# Patient Record
Sex: Female | Born: 1995 | Race: White | Hispanic: No | Marital: Single | Smoking: Current some day smoker
Health system: Southern US, Community
[De-identification: ages and names within clinical notes are randomized; demographics above are authoritative.]

## PROBLEM LIST (undated history)

## (undated) DIAGNOSIS — F419 Anxiety disorder, unspecified: Secondary | ICD-10-CM

## (undated) HISTORY — PX: COLONOSCOPY: SHX174

## (undated) HISTORY — PX: OTHER SURGICAL HISTORY: SHX169

## (undated) HISTORY — PX: ESOPHAGOGASTRODUODENOSCOPY: SHX1529

## (undated) HISTORY — PX: TONSILLECTOMY: SUR1361

---
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
## 2017-01-16 ENCOUNTER — Emergency Department
Admission: EM | Admit: 2017-01-16 | Discharge: 2017-01-17 | Disposition: A | Discharge status: Home / Self Care | Payer: Medicaid Other | Attending: Emergency Medicine | Admitting: Emergency Medicine

## 2017-01-16 ENCOUNTER — Encounter: Payer: Self-pay | Admitting: Emergency Medicine

## 2017-01-16 DIAGNOSIS — Y999 Unspecified external cause status: Secondary | ICD-10-CM | POA: Insufficient documentation

## 2017-01-16 DIAGNOSIS — Y939 Activity, unspecified: Secondary | ICD-10-CM | POA: Diagnosis not present

## 2017-01-16 DIAGNOSIS — W260XXA Contact with knife, initial encounter: Secondary | ICD-10-CM | POA: Diagnosis not present

## 2017-01-16 DIAGNOSIS — Y929 Unspecified place or not applicable: Secondary | ICD-10-CM | POA: Insufficient documentation

## 2017-01-16 DIAGNOSIS — S61012A Laceration without foreign body of left thumb without damage to nail, initial encounter: Secondary | ICD-10-CM | POA: Diagnosis present

## 2017-01-16 DIAGNOSIS — S61412A Laceration without foreign body of left hand, initial encounter: Secondary | ICD-10-CM

## 2017-01-16 MED ORDER — LIDOCAINE HCL (PF) 1 % IJ SOLN
2.0000 mL | Freq: Once | INTRAMUSCULAR | Status: AC
  Administered 2017-01-16: 2 mL

## 2017-01-16 MED ORDER — LIDOCAINE HCL (PF) 1 % IJ SOLN
INTRAMUSCULAR | Status: AC
  Filled 2017-01-16: qty 5

## 2017-01-16 NOTE — ED Triage Notes (Signed)
Pt states that she was cutting open a package and the knife slipped and she tried to catch it with her left hand. Pt has laceration to left thumb. Bleeding is controlled and hand is wrapped at this time. Pt is ambulatory to triage with NAD noted at this time.

## 2017-01-16 NOTE — ED Notes (Signed)
2cm laceration to left palm, bleeding controlled at this time, irrigated with sterile saline

## 2017-01-17 NOTE — Discharge Instructions (Signed)
Return to the ER for any increase in pain and redness warmth or drainage. Follow-up in 8-10 days for suture removal. Keep clean and covered. Do not submerge in dirty water.

## 2017-01-17 NOTE — ED Provider Notes (Signed)
[facility]-EMERGENCY DEPARTMENT Provider Note   CSN: [id number] Arrival date & time: 01/16/17  2236     History   Chief Complaint Chief Complaint  Patient presents with  . Extremity Laceration    HPI TANNIA CONTINO is a 21 y.o. female presents to the emergency department for evaluation of laceration to the left hand. Patient dropped a knife with her right hand, caught it with her left and ended up suffering a laceration to the thenar eminence of the left hand. No numbness or tingling. No limited range of motion. Bleeding well controlled, tetanus up-to-date. Pain is mild.  HPI  History reviewed. No pertinent past medical history.  There are no active problems to display for this patient.   Past Surgical History:  Procedure Laterality Date  . adenoids    . TONSILLECTOMY      OB History    No data available       Home Medications    Prior to Admission medications   Not on File    Family History No family history on file.  Social History Social History  Substance Use Topics  . Smoking status: Never Smoker  . Smokeless tobacco: Never Used  . Alcohol use No     Allergies   Other and Peanut-containing drug products   Review of Systems Review of Systems  Constitutional: Negative for fever.  Skin: Positive for wound. Negative for color change.  Neurological: Negative for weakness and numbness.     Physical Exam Updated Vital Signs BP (!) 119/59 (BP Location: Left Arm)   Pulse 78   Temp 98.3 F (36.8 C) (Oral)   Resp 16   Ht 5\' 10"  (1.778 m)   Wt 71.7 kg   LMP 01/16/2017 (Exact Date)   SpO2 100%   BMI 22.67 kg/m   Physical Exam  Constitutional: She appears well-developed and well-nourished.  HENT:  Head: Normocephalic and atraumatic.  Eyes: Conjunctivae and EOM are normal.  Neck: Normal range of motion.  Cardiovascular: Normal rate.   Pulmonary/Chest: No respiratory distress.  Musculoskeletal:  Examination of the left hand shows  laceration to the thenar eminence, sensation is intact throughout the hand. She has full range of motion of thumb with no discomfort. No sign of foreign body. Laceration is linear, 3 cm.     ED Treatments / Results  Labs (all labs ordered are listed, but only abnormal results are displayed) Labs Reviewed - No data to display  EKG  EKG Interpretation None       Radiology No results found.  Procedures Procedures (including critical care time) LACERATION REPAIR Performed by: Patience Musca Authorized by: Patience Musca Consent: Verbal consent obtained. Risks and benefits: risks, benefits and alternatives were discussed Consent given by: patient Patient identity confirmed: provided demographic data Prepped and Draped in normal sterile fashion Wound explored  Laceration Location: Left thumb base  Laceration Length: 31 cm  No Foreign Bodies seen or palpated  Anesthesia: local infiltration  Local anesthetic: lidocaine 1% without epinephrine  Anesthetic total: 3 ml  Irrigation method: syringe Amount of cleaning: standard  Skin closure: Simple interrupted 5-0 nylon   Number of sutures: 5   Technique: Simple interrupted   Patient tolerance: Patient tolerated the procedure well with no immediate complications.    Medications Ordered in ED Medications  lidocaine (PF) (XYLOCAINE) 1 % injection 2 mL (2 mLs Infiltration Given 01/16/17 2336)     Initial Impression / Assessment and Plan / ED Course  I have  reviewed the triage vital signs and the nursing notes.  Pertinent labs & imaging results that were available during my care of the patient were reviewed by me and considered in my medical decision making (see chart for details).     21 year old female with laceration to the left hand. Neurovascularly intact no tendon deficits noted. No sign of foreign body. Tetanus is up-to-date. Laceration repaired with #5 5-0 nylon sutures. She is  educated on laceration and wound care. Will follow-up in 8-10 days for suture removal. Return sooner for any redness warmth or drainage.  Final Clinical Impressions(s) / ED Diagnoses   Final diagnoses:  Laceration of left hand without foreign body, initial encounter    New Prescriptions New Prescriptions   No medications on file     Evon Slackhomas C Gaines, PA-C 01/17/17 0024    Emily FilbertJonathan E Williams, MD 01/17/17 [phone]

## 2017-03-21 ENCOUNTER — Encounter: Payer: Self-pay | Admitting: Emergency Medicine

## 2017-03-21 ENCOUNTER — Emergency Department
Admission: EM | Admit: 2017-03-21 | Discharge: 2017-03-21 | Disposition: A | Discharge status: Home / Self Care | Payer: BLUE CROSS/BLUE SHIELD | Attending: Emergency Medicine | Admitting: Emergency Medicine

## 2017-03-21 ENCOUNTER — Emergency Department: Payer: BLUE CROSS/BLUE SHIELD

## 2017-03-21 DIAGNOSIS — M7541 Impingement syndrome of right shoulder: Secondary | ICD-10-CM | POA: Diagnosis not present

## 2017-03-21 DIAGNOSIS — M25511 Pain in right shoulder: Secondary | ICD-10-CM | POA: Diagnosis present

## 2017-03-21 MED ORDER — KETOROLAC TROMETHAMINE 30 MG/ML IJ SOLN
30.0000 mg | Freq: Once | INTRAMUSCULAR | Status: AC
  Administered 2017-03-21: 30 mg via INTRAMUSCULAR
  Filled 2017-03-21: qty 1

## 2017-03-21 MED ORDER — MELOXICAM 15 MG PO TABS: 15.0000 mg | ORAL_TABLET | Freq: Every day | ORAL | 0 refills | Status: DC

## 2017-03-21 NOTE — ED Provider Notes (Signed)
[facility] Emergency Department Provider Note  ____________________________________________  Time seen: Approximately 8:43 PM  I have reviewed the triage vital signs and the nursing notes.   HISTORY  Chief Complaint Shoulder Injury (R)    HPI Wendy Stone is a 21 y.o. female He presents emergency department complaining of right shoulder pain with numbness and tingling down the right arm. Patient states that she went to jump on her other sister's back and felt a sharp pain to the anterior aspect of her right shoulder. Patient reports that her entire armwith instantly numb. Patient states that she lost sensation to the arm. Since injury, patient has regained majority of sensation. She has good range of motion. She reports that extension or flexion of the shoulder past 90 does elicit some pain to the anterior aspect of the shoulder. She denies any neck pain. She reports that she still has some residual numbness in the thumb, second digit, third digit but that sensation has completely returned to the fourth and fifth digits. No medications prior to arrival.   History reviewed. No pertinent past medical history.  There are no active problems to display for this patient.   Past Surgical History:  Procedure Laterality Date  . adenoids    . TONSILLECTOMY      Prior to Admission medications   Medication Sig Start Date End Date Taking? Authorizing Provider  meloxicam (MOBIC) 15 MG tablet Take 1 tablet (15 mg total) by mouth daily. 03/21/17   Delorise Royals Cuthriell, PA-C    Allergies Other and Peanut-containing drug products  History reviewed. No pertinent family history.  Social History Social History  Substance Use Topics  . Smoking status: Never Smoker  . Smokeless tobacco: Never Used  . Alcohol use No     Review of Systems  Constitutional: No fever/chills Eyes: No visual changes.  Cardiovascular: no chest pain. Respiratory: no cough. No  SOB. Gastrointestinal: No abdominal pain.  No nausea, no vomiting.   Musculoskeletal: positive for right shoulder pain with numbness and tingling of the right arm. Skin: Negative for rash, abrasions, lacerations, ecchymosis. Neurological: Negative for headaches, focal weakness or numbness. 10-point ROS otherwise negative.  ____________________________________________   PHYSICAL EXAM:  VITAL SIGNS: ED Triage Vitals  Enc Vitals Group     BP 03/21/17 1835 123/62     Pulse Rate 03/21/17 1835 78     Resp 03/21/17 1835 18     Temp 03/21/17 1835 98.3 F (36.8 C)     Temp Source 03/21/17 1835 Oral     SpO2 03/21/17 1835 100 %     Weight 03/21/17 1835 163 lb (73.9 kg)     Height 03/21/17 1835  (1.778 m)     Head Circumference --      Peak Flow --      Pain Score 03/21/17 1834 0     Pain Loc --      Pain Edu? --      Excl. in GC? --      Constitutional: Alert and oriented. Well appearing and in no acute distress. Eyes: Conjunctivae are normal. PERRL. EOMI. Head: Atraumatic. Neck: No stridor.  No cervical spine tenderness to palpation.  Cardiovascular: Normal rate, regular rhythm. Normal S1 and S2.  Good peripheral circulation. Respiratory: Normal respiratory effort without tachypnea or retractions. Lungs CTAB. Good air entry to the bases with no decreased or absent breath sounds. Musculoskeletal: Full range of motion to all extremities. No gross deformities appreciated.no visible deformity or  edema noted to the right shoulder but inspection. With coaxing, patient has full range of motion. There is pain with range of motion above 90 on flexion and extension. Patient is mildly tender to palpation over the [facility] joint space. No palpable abnormality over the musculature or osseous structures of the right shoulder. Examination of the cervical spine and right elbow are unremarkable. Radial pulse intact distally. Patient has sensation 5 digits but decreased sensation to the thumb, second  digit, third digit.  Neurologic:  Normal speech and language. No gross focal neurologic deficits are appreciated.  Skin:  Skin is warm, dry and intact. No rash noted. Psychiatric: Mood and affect are normal. Speech and behavior are normal. Patient exhibits appropriate insight and judgement.   ____________________________________________   LABS (all labs ordered are listed, but only abnormal results are displayed)  Labs Reviewed - No data to display ____________________________________________  EKG   ____________________________________________  RADIOLOGY Festus Barren Cuthriell, personally viewed and evaluated these images (plain radiographs) as part of my medical decision making, as well as reviewing the written report by the radiologist.  Dg Shoulder Right  Result Date: 03/21/2017 CLINICAL DATA:  Right shoulder pain EXAM: RIGHT SHOULDER - 2+ VIEW COMPARISON:  None. FINDINGS: There is no evidence of fracture or dislocation. There is no evidence of arthropathy or other focal bone abnormality. Soft tissues are unremarkable. IMPRESSION: Negative. Electronically Signed   By: Signa Kell M.D.   On: 03/21/2017 19:02    ____________________________________________    PROCEDURES  Procedure(s) performed:    Procedures    Medications  ketorolac (TORADOL) 30 MG/ML injection 30 mg (not administered)     ____________________________________________   INITIAL IMPRESSION / ASSESSMENT AND PLAN / ED COURSE  Pertinent labs & imaging results that were available during my care of the patient were reviewed by me and considered in my medical decision making (see chart for details).  Review of the [place] CSRS was performed in accordance of the [facility] prior to dispensing any controlled drugs.     Patient's diagnosis is consistent with impingement syndrome to the right shoulder. Patient had jerking injury to the right shoulder. She immediately had complete numbness to the right upper  extremity. This is resolving with some residual minor numbness to the thumb, second digit, third digit. X-ray reveals no acute osseous abnormality. Exam is consistent with impingement syndrome.patient is given an injection of Toradol in the emergency department. Patient will be discharged home with prescriptions for meloxicam. Patient is to follow up with orthopedics as needed or otherwise directed. Patient is given ED precautions to return to the ED for any worsening or new symptoms.     ____________________________________________  FINAL CLINICAL IMPRESSION(S) / ED DIAGNOSES  Final diagnoses:  Impingement syndrome of right shoulder      NEW MEDICATIONS STARTED DURING THIS VISIT:  New Prescriptions   MELOXICAM (MOBIC) 15 MG TABLET    Take 1 tablet (15 mg total) by mouth daily.        This chart was dictated using voice recognition software/Dragon. Despite best efforts to proofread, errors can occur which can change the meaning. Any change was purely unintentional.    Racheal Patches, PA-C 03/21/17 4098    Merrily Brittle, MD 03/21/17 [phone]

## 2017-03-21 NOTE — ED Triage Notes (Signed)
Pt presents to c/o R shoulder injury. Pt states she jumped onto her sister's back and felt pain go from R side of neck into hand. States RUE feels numb at this time. Pulses, grip strength, and cap refill equal bilaterally. Limited ROM to shoulder d/t pain. No obvious deformity noted.

## 2017-03-21 NOTE — ED Notes (Signed)

## 2017-04-29 ENCOUNTER — Ambulatory Visit: Payer: BLUE CROSS/BLUE SHIELD | Admitting: Family

## 2017-07-06 ENCOUNTER — Encounter: Payer: Self-pay | Admitting: Emergency Medicine

## 2017-07-06 ENCOUNTER — Emergency Department
Admission: EM | Admit: 2017-07-06 | Discharge: 2017-07-06 | Disposition: A | Discharge status: Home / Self Care | Payer: BLUE CROSS/BLUE SHIELD | Attending: Emergency Medicine | Admitting: Emergency Medicine

## 2017-07-06 ENCOUNTER — Emergency Department: Payer: BLUE CROSS/BLUE SHIELD

## 2017-07-06 DIAGNOSIS — M25511 Pain in right shoulder: Secondary | ICD-10-CM | POA: Diagnosis present

## 2017-07-06 DIAGNOSIS — M5410 Radiculopathy, site unspecified: Secondary | ICD-10-CM | POA: Diagnosis not present

## 2017-07-06 MED ORDER — CYCLOBENZAPRINE HCL 10 MG PO TABS: 10.0000 mg | ORAL_TABLET | Freq: Three times a day (TID) | ORAL | 0 refills | Status: DC | PRN

## 2017-07-06 MED ORDER — MELOXICAM 15 MG PO TABS: 15.0000 mg | ORAL_TABLET | Freq: Every day | ORAL | 0 refills | Status: DC

## 2017-07-06 NOTE — Discharge Instructions (Signed)
Follow up with the orthopedic doctor for symptoms that are not improving over the next few days.  Return to the ER for symptoms that change or worsen if unable to schedule an appointment.

## 2017-07-06 NOTE — ED Triage Notes (Signed)
Presents with right shoulder pain which started 2 days ago  Denies any injury   States she is having increased pain with movement

## 2017-07-06 NOTE — ED Provider Notes (Signed)
[facility] Emergency Department Provider Note ____________________________________________  Time seen: Approximately 4:01 PM  I have reviewed the triage vital signs and the nursing notes.   HISTORY  Chief Complaint Shoulder Pain    HPI Wendy Stone is a 21 y.o. female who presents to the emergency department for evaluation of nontraumatic right shoulder pain. She has a history of 2 previous shoulder dislocations, but denies recent injury. Pain started while driving and radiates into the right shoulder blade, neck, and down the arm into the hand. No relief with tylenol or ibuprofen.  History reviewed. No pertinent past medical history.  There are no active problems to display for this patient.   Past Surgical History:  Procedure Laterality Date  . adenoids    . TONSILLECTOMY      Prior to Admission medications   Medication Sig Start Date End Date Taking? Authorizing Provider  PARoxetine (PAXIL) 10 MG tablet Take 10 mg by mouth daily.   Yes [provider]  cyclobenzaprine (FLEXERIL) 10 MG tablet Take 1 tablet (10 mg total) by mouth 3 (three) times daily as needed for muscle spasms. 07/06/17   Lakiesha Ralphs, Rulon Eisenmenger B, FNP  meloxicam (MOBIC) 15 MG tablet Take 1 tablet (15 mg total) by mouth daily. 07/06/17   Carmyn Hamm, Rulon Eisenmenger B, FNP    Allergies Other; Peanut-containing drug products; Augmentin [amoxicillin-pot clavulanate]; Keflex [cephalexin]; and Meclizine  No family history on file.  Social History Social History  Substance Use Topics  . Smoking status: Never Smoker  . Smokeless tobacco: Never Used  . Alcohol use No    Review of Systems Constitutional: Negative for recent injury Cardiovascular: Negative for change in skin temperature or color. Respiratory: Negative for cough or shortness of breath. Musculoskeletal: Positive for right shoulder pain with radiation. Skin: Negative for rash, lesion, or wound.  Neurological: Positive for  radiculopathy from the right shoulder.  ____________________________________________   PHYSICAL EXAM:  VITAL SIGNS: ED Triage Vitals  Enc Vitals Group     BP 07/06/17 1527 107/60     Pulse Rate 07/06/17 1527 88     Resp 07/06/17 1527 16     Temp 07/06/17 1527 98 F (36.7 C)     Temp Source 07/06/17 1527 Oral     SpO2 07/06/17 1527 99 %     Weight 07/06/17 1521 159 lb (72.1 kg)     Height 07/06/17 1521 5\' 10"  (1.778 m)     Head Circumference --      Peak Flow --      Pain Score 07/06/17 1521 7     Pain Loc --      Pain Edu? --      Excl. in GC? --     Constitutional: Alert and oriented. Well appearing and in no acute distress. Eyes: Conjunctivae are clear without discharge or drainage.  Head: Atraumatic. Neck: Full, active ROM observed.  Respiratory: Breath sounds clear throughout. Musculoskeletal: Right shoulder ROM limited by pain--worse with abduction past 90* and external rotation. Tenderness in the anterior subacromial bursa area on palpation. Grip strength is equal. Neurologic: Radiculopathy reported to the right lateral neck, right scapula, down the anterior portion of the right humerus, forearm, and hand.  Skin: Atraumatic.  Psychiatric: Affect and behavior are normal.  ____________________________________________   LABS (all labs ordered are listed, but only abnormal results are displayed)  Labs Reviewed - No data to display ____________________________________________  RADIOLOGY  No acute bony abnormality of the right shoulder per radiology. ____________________________________________  PROCEDURES  Procedure(s) performed: None.  ____________________________________________   INITIAL IMPRESSION / ASSESSMENT AND PLAN / ED COURSE  Wendy Stone is a 21 y.o. female who presents to the emergency department for evaluation of right shoulder pain that started while driving. Pain is unlike previous shoulder pain. No relief with tylenol or ibuprofen at  home. On exam, focal tenderness is located over the anterior joint line. X-ray ordered.  4:23 PM  Right shoulder image is negative for bony abnormality. She will be given a prescription for meloxicam and flexeril and advised to follow up with orthopedics for symptoms that are not improving over the next week or so.   Pertinent labs & imaging results that were available during my care of the patient were reviewed by me and considered in my medical decision making (see chart for details).  _________________________________________   FINAL CLINICAL IMPRESSION(S) / ED DIAGNOSES  Final diagnoses:  Acute pain of right shoulder  Radiculopathy, unspecified spinal region    Discharge Medication List as of 07/06/2017  4:26 PM    START taking these medications   Details  cyclobenzaprine (FLEXERIL) 10 MG tablet Take 1 tablet (10 mg total) by mouth 3 (three) times daily as needed for muscle spasms., Starting Mon 07/06/2017, Print        If controlled substance prescribed during this visit, 12 month history viewed on the [facility] prior to issuing an initial prescription for Schedule II or III opiod.    Chinita Pesterriplett, Loney Domingo B, FNP 07/06/17 1657    Phineas SemenGoodman, Graydon, MD 07/06/17 [id number]

## 2017-11-05 ENCOUNTER — Other Ambulatory Visit: Payer: Self-pay | Admitting: Sports Medicine

## 2017-11-05 DIAGNOSIS — M25311 Other instability, right shoulder: Secondary | ICD-10-CM

## 2017-11-05 DIAGNOSIS — M25511 Pain in right shoulder: Principal | ICD-10-CM

## 2017-11-05 DIAGNOSIS — G8929 Other chronic pain: Secondary | ICD-10-CM

## 2017-11-26 ENCOUNTER — Ambulatory Visit
Admission: RE | Admit: 2017-11-26 | Discharge: 2017-11-26 | Disposition: A | Discharge status: Home / Self Care | Payer: BLUE CROSS/BLUE SHIELD | Source: Ambulatory Visit | Attending: Sports Medicine | Admitting: Sports Medicine

## 2017-11-26 DIAGNOSIS — X58XXXA Exposure to other specified factors, initial encounter: Secondary | ICD-10-CM | POA: Diagnosis not present

## 2017-11-26 DIAGNOSIS — S43431A Superior glenoid labrum lesion of right shoulder, initial encounter: Secondary | ICD-10-CM | POA: Insufficient documentation

## 2017-11-26 DIAGNOSIS — M25511 Pain in right shoulder: Principal | ICD-10-CM

## 2017-11-26 DIAGNOSIS — G8929 Other chronic pain: Secondary | ICD-10-CM

## 2017-11-26 DIAGNOSIS — M25311 Other instability, right shoulder: Secondary | ICD-10-CM

## 2017-11-26 DIAGNOSIS — S46111A Strain of muscle, fascia and tendon of long head of biceps, right arm, initial encounter: Secondary | ICD-10-CM | POA: Diagnosis not present

## 2017-11-26 MED ORDER — SODIUM CHLORIDE 0.9 % IJ SOLN
5.0000 mL | INTRAMUSCULAR | Status: DC | PRN
  Administered 2017-11-26: 5 mL
  Filled 2017-11-26: qty 10

## 2017-11-26 MED ORDER — IOPAMIDOL (ISOVUE-200) INJECTION 41%
17.0000 mL | Freq: Once | INTRAVENOUS | Status: AC | PRN
  Administered 2017-11-26: 17 mL
  Filled 2017-11-26: qty 50

## 2017-11-26 MED ORDER — GADOBENATE DIMEGLUMINE 529 MG/ML IV SOLN
0.1000 mL | Freq: Once | INTRAVENOUS | Status: AC | PRN
  Administered 2017-11-26: 0.1 mL via INTRA_ARTICULAR

## 2017-11-26 MED ORDER — LIDOCAINE HCL (PF) 1 % IJ SOLN
5.0000 mL | Freq: Once | INTRAMUSCULAR | Status: AC
  Administered 2017-11-26: 5 mL
  Filled 2017-11-26: qty 5

## 2018-02-08 ENCOUNTER — Encounter: Payer: Self-pay | Admitting: Emergency Medicine

## 2018-02-08 ENCOUNTER — Other Ambulatory Visit: Payer: Self-pay

## 2018-02-08 ENCOUNTER — Emergency Department: Payer: BLUE CROSS/BLUE SHIELD

## 2018-02-08 ENCOUNTER — Emergency Department
Admission: EM | Admit: 2018-02-08 | Discharge: 2018-02-08 | Disposition: A | Discharge status: Home / Self Care | Payer: BLUE CROSS/BLUE SHIELD | Attending: Emergency Medicine | Admitting: Emergency Medicine

## 2018-02-08 DIAGNOSIS — M65831 Other synovitis and tenosynovitis, right forearm: Secondary | ICD-10-CM | POA: Diagnosis not present

## 2018-02-08 DIAGNOSIS — Z79899 Other long term (current) drug therapy: Secondary | ICD-10-CM | POA: Diagnosis not present

## 2018-02-08 DIAGNOSIS — M25531 Pain in right wrist: Secondary | ICD-10-CM | POA: Diagnosis present

## 2018-02-08 DIAGNOSIS — M778 Other enthesopathies, not elsewhere classified: Secondary | ICD-10-CM

## 2018-02-08 MED ORDER — PREDNISONE 10 MG PO TABS: ORAL_TABLET | ORAL | 0 refills | Status: DC

## 2018-02-08 NOTE — Discharge Instructions (Signed)
Follow-up with your primary care doctor or Dr. Joice LoftsPoggi who is the orthopedist on call today.  Take medication as directed with tapering dose of prednisone.  You may take Tylenol with this medication.  If any continued problems then you will need to make an appointment with Dr. Gigi GinPeggy.  You may use your wrist splint for support for the next 7-10 days.  Ice and elevation as needed for pain or swelling.

## 2018-02-08 NOTE — ED Notes (Signed)
See triage note.  Presents with right wrist pain for about 1 week  Denies any injury  States she is having increased pain with movement  States she is using a splint with min relief and has used OTC meds

## 2018-02-08 NOTE — ED Triage Notes (Signed)
Pt with right wrist pain for one week, no known injury.

## 2018-02-08 NOTE — ED Provider Notes (Signed)
[facility] Emergency Department Provider Note  ____________________________________________   First MD Initiated Contact with Patient 02/08/18 0848     (approximate)  I have reviewed the triage vital signs and the nursing notes.   HISTORY  Chief Complaint Wrist Pain   HPI Wendy Stone is a 22 y.o. female is here with complaint of right wrist pain.  Patient states that she has had pain for 1 week.  She denies any injury.  She has not been taking any over-the-counter medication but has been wearing a wrist splint for support.  She states that in the past she has had 3 injuries including a fracture to the same wrist.  Patient is right-hand dominant.  She rates her pain as an 8 out of 10.  History reviewed. No pertinent past medical history.  There are no active problems to display for this patient.   Past Surgical History:  Procedure Laterality Date  . adenoids    . TONSILLECTOMY      Prior to Admission medications   Medication Sig Start Date End Date Taking? Authorizing Provider  PARoxetine (PAXIL) 10 MG tablet Take 10 mg by mouth daily.    [provider]  predniSONE (DELTASONE) 10 MG tablet Take 6 tablets  today, on day 2 take 5 tablets, day 3 take 4 tablets, day 4 take 3 tablets, day 5 take  2 tablets and 1 tablet the last day 02/08/18   Wendy Stone, Wendy Curvin L, PA-C    Allergies Other; Peanut-containing drug products; Augmentin [amoxicillin-pot clavulanate]; Keflex [cephalexin]; and Meclizine  No family history on file.  Social History Social History   Tobacco Use  . Smoking status: Never Smoker  . Smokeless tobacco: Never Used  Substance Use Topics  . Alcohol use: No  . Drug use: No    Review of Systems Constitutional: No fever/chills Cardiovascular: Denies chest pain. Respiratory: Denies shortness of breath. Gastrointestinal:  No nausea, no vomiting. Musculoskeletal: Positive for right wrist pain. Skin: Negative for  rash. Neurological: Negative for focal weakness or numbness. ___________________________________________   PHYSICAL EXAM:  VITAL SIGNS: ED Triage Vitals  Enc Vitals Group     BP 02/08/18 0816 121/63     Pulse Rate 02/08/18 0816 83     Resp 02/08/18 0816 20     Temp 02/08/18 0816 98 F (36.7 C)     Temp Source 02/08/18 0816 Oral     SpO2 02/08/18 0816 100 %     Weight 02/08/18 0817 159 lb (72.1 kg)     Height --      Head Circumference --      Peak Flow --      Pain Score 02/08/18 0817 8     Pain Loc --      Pain Edu? --      Excl. in GC? --    Constitutional: Alert and oriented. Well appearing and in no acute distress. Eyes: Conjunctivae are normal.  Head: Atraumatic. Neck: No stridor.   Cardiovascular: Normal rate, regular rhythm. Grossly normal heart sounds.  Good peripheral circulation. Respiratory: Normal respiratory effort.  No retractions. Lungs CTAB. Musculoskeletal: On examination of the right wrist there is no gross deformity and no soft tissue swelling present.  Patient is resistant to movement in flexion and extension secondary to pain.  Pulses present.  Patient is able to make a complete fist without limitations.  Motor sensory function intact.  No ecchymosis, abrasions, erythema noted on exam. Neurologic:  Normal speech and language.  No gross focal neurologic deficits are appreciated.  Skin:  Skin is warm, dry and intact.  Psychiatric: Mood and affect are normal. Speech and behavior are normal.  ____________________________________________   LABS (all labs ordered are listed, but only abnormal results are displayed)  Labs Reviewed - No data to display  RADIOLOGY  ED MD interpretation:   Right wrist x-ray is negative for fracture.  Official radiology report(s): Dg Wrist Complete Right  Result Date: 02/08/2018 CLINICAL DATA:  Right wrist pain for the past week.  No injury. EXAM: RIGHT WRIST - COMPLETE 3+ VIEW COMPARISON:  None. FINDINGS: There is no  evidence of fracture or dislocation. There is no evidence of arthropathy or other focal bone abnormality. Soft tissues are unremarkable. IMPRESSION: Negative. Electronically Signed   By: Wendy Stone M.D.   On: 02/08/2018 08:43    ____________________________________________   PROCEDURES  Procedure(s) performed: None  Procedures  Critical Care performed: No  ____________________________________________   INITIAL IMPRESSION / ASSESSMENT AND PLAN / ED COURSE Patient is already taking ibuprofen without any relief.  She is to continue wearing her cockup wrist splint that she is wearing currently.  She was placed on prednisone to begin taking as a 60mg  6-day taper.  She will follow-up with her or Dr. Joice Stone who is on-call for orthopedics if any continued problems. ____________________________________________   FINAL CLINICAL IMPRESSION(S) / ED DIAGNOSES  Final diagnoses:  Tendinitis of right wrist     ED Discharge Orders        Ordered    predniSONE (DELTASONE) 10 MG tablet     02/08/18 0902       Note:  This document was prepared using Dragon voice recognition software and may include unintentional dictation errors.    Wendy Rumps, PA-C 02/08/18 1152    Wendy Sickle, MD 02/09/18 1721

## 2018-02-27 ENCOUNTER — Emergency Department
Admission: EM | Admit: 2018-02-27 | Discharge: 2018-02-27 | Disposition: A | Discharge status: Home / Self Care | Payer: BLUE CROSS/BLUE SHIELD | Attending: Emergency Medicine | Admitting: Emergency Medicine

## 2018-02-27 DIAGNOSIS — R0600 Dyspnea, unspecified: Secondary | ICD-10-CM | POA: Diagnosis present

## 2018-02-27 DIAGNOSIS — T7801XA Anaphylactic reaction due to peanuts, initial encounter: Secondary | ICD-10-CM | POA: Insufficient documentation

## 2018-02-27 DIAGNOSIS — Z91010 Allergy to peanuts: Secondary | ICD-10-CM

## 2018-02-27 DIAGNOSIS — T782XXA Anaphylactic shock, unspecified, initial encounter: Secondary | ICD-10-CM

## 2018-02-27 MED ORDER — PREDNISONE 20 MG PO TABS: 40.0000 mg | ORAL_TABLET | Freq: Every day | ORAL | 0 refills | Status: DC

## 2018-02-27 MED ORDER — FAMOTIDINE IN NACL 20-0.9 MG/50ML-% IV SOLN
20.0000 mg | Freq: Once | INTRAVENOUS | Status: AC
  Administered 2018-02-27: 20 mg via INTRAVENOUS

## 2018-02-27 MED ORDER — METHYLPREDNISOLONE SODIUM SUCC 125 MG IJ SOLR
125.0000 mg | Freq: Once | INTRAMUSCULAR | Status: AC
  Administered 2018-02-27: 125 mg via INTRAVENOUS

## 2018-02-27 MED ORDER — EPINEPHRINE 0.3 MG/0.3ML IJ SOAJ: 0.3000 mg | Freq: Once | INTRAMUSCULAR | 0 refills | Status: AC

## 2018-02-27 MED ORDER — EPINEPHRINE 0.3 MG/0.3ML IJ SOAJ
0.3000 mg | Freq: Once | INTRAMUSCULAR | Status: AC
  Administered 2018-02-27: 0.3 mg via INTRAMUSCULAR

## 2018-02-27 MED ORDER — DIPHENHYDRAMINE HCL 50 MG/ML IJ SOLN
50.0000 mg | Freq: Once | INTRAMUSCULAR | Status: AC
  Administered 2018-02-27: 50 mg via INTRAVENOUS

## 2018-02-27 NOTE — ED Triage Notes (Signed)
Pt working downstairs on the lower level, states that she touched peanuts that were on the table, pt started itching to the side of her neck, states that she started coughing with difficulty breathing

## 2018-02-27 NOTE — ED Provider Notes (Signed)
[facility] Emergency Department Provider Note   ____________________________________________   First MD Initiated Contact with Patient 02/27/18 1218     (approximate)  I have reviewed the triage vital signs and the nursing notes.   HISTORY  Chief Complaint Allergic Reaction  EM caveat: Severe respiratory distress  HPI Kamryn Messineo Minnifield is a 22 y.o. female reports previously healthy except for tendinitis  Patient was working just about 15-20 minutes ago, she reports that she was exposed and excellently touched peanuts that the patient had/peanut butter.  She then began having severe itching trouble breathing and swallowing.  Patient reports a scratching feeling like her throat is closing.  Took an epinephrine autoinjector about 5 minutes ago     No past medical history on file. Severe peanut allergy There are no active problems to display for this patient.   Past Surgical History:  Procedure Laterality Date  . adenoids    . TONSILLECTOMY      Prior to Admission medications   Medication Sig Start Date End Date Taking? Authorizing Provider  EPINEPHrine 0.3 mg/0.3 mL IJ SOAJ injection Inject 0.3 mLs (0.3 mg total) into the muscle once for 1 dose. 02/27/18 02/27/18  Sharyn Creamer, MD  PARoxetine (PAXIL) 10 MG tablet Take 10 mg by mouth daily.    [provider]  predniSONE (DELTASONE) 20 MG tablet Take 2 tablets (40 mg total) by mouth daily with breakfast. 02/27/18   Sharyn Creamer, MD    Allergies Other; Peanut-containing drug products; Augmentin [amoxicillin-pot clavulanate]; Keflex [cephalexin]; and Meclizine  No family history on file.  Social History Social History   Tobacco Use  . Smoking status: Never Smoker  . Smokeless tobacco: Never Used  Substance Use Topics  . Alcohol use: No  . Drug use: No    Review of Systems EM caveat: Severe respiratory distress ____________________________________________   PHYSICAL  EXAM:  VITAL SIGNS: ED Triage Vitals [02/27/18 1219]  Enc Vitals Group     BP (!) 152/70     Pulse Rate (!) 128     Resp (!) 28     Temp      Temp src      SpO2 100 %     Weight      Height      Head Circumference      Peak Flow      Pain Score      Pain Loc      Pain Edu?      Excl. in GC?     Constitutional: Alert and oriented.  Sitting upright somewhat tripoding holding her hands over her throat reporting trouble breathing.  Also coughing frequently with a dry cough. Eyes: Conjunctivae are normal. Head: Atraumatic. Nose: No congestion/rhinnorhea. Mouth/Throat: Mucous membranes are moist.  No oral pharyngeal edema denoted. Neck: No stridor.  Itching with some hives/excoriations noted over her anterior neck Cardiovascular: Tachycardic rate, regular rhythm. Grossly normal heart sounds.  Good peripheral circulation. Respiratory: Lungs are clear bilateral, but she is a frequent dry cough, and reports it feels like her throat was closing.  Sitting upright. Gastrointestinal: Soft and nontender. No distention. Musculoskeletal: No lower extremity tenderness nor edema. Neurologic:  Normal speech and language. No gross focal neurologic deficits are appreciated.  Skin:  Skin is warm, dry and intact. No rash noted. Psychiatric: Mood and affect are very anxious. Speech and behavior are normal.  ____________________________________________   LABS (all labs ordered are listed, but only abnormal results are displayed)  Labs Reviewed - No data to display ____________________________________________  EKG  Reviewed and told by me at 1230 Heart rate 130 QRS 100 QTC 430 Sinus tachycardia ____________________________________________  RADIOLOGY   ____________________________________________   PROCEDURES  Procedure(s) performed: None  Procedures  Critical Care performed: Yes, see critical care note(s)  CRITICAL CARE Performed by: Sharyn Creamer   Total critical care time:  35 minutes  Critical care time was exclusive of separately billable procedures and treating other patients.  Critical care was necessary to treat or prevent imminent or life-threatening deterioration.  Critical care was time spent personally by me on the following activities: development of treatment plan with patient and/or surrogate as well as nursing, discussions with consultants, evaluation of patient's response to treatment, examination of patient, obtaining history from patient or surrogate, ordering and performing treatments and interventions, ordering and review of laboratory studies, ordering and review of radiographic studies, pulse oximetry and re-evaluation of patient's condition.  ____________________________________________   INITIAL IMPRESSION / ASSESSMENT AND PLAN / ED COURSE  Pertinent labs & imaging results that were available during my care of the patient were reviewed by me and considered in my medical decision making (see chart for details).  Patient presents with sudden severe shortness of breath and difficulty breathing with scratching and throat closing sensation after being exposed to peanuts, unknown allergen to her.  She self administered an epinephrine autoinjector but still has ongoing distress and feeling like her throat is tight or scratching.  Patient administered an additional auto injector in the ER due to ongoing symptomatology involving her potential upper airway involvement.  Clinical Course as of Feb 28 1523  Sat Feb 27, 2018  1223 Patient reporting improvement.  Reports she is able to breathe better now.  Still itching slightly, but reports she feels like her symptoms are much improved.  She is tremulous, slightly tachycardic likely from the 2 doses of epinephrine she received.  Not any pain.  She appears improved.  Respirating comfortably at this time.  No oral pharyngeal edema denoted.  No stridor.  [MQ]    Clinical Course User Index [MQ] Sharyn Creamer,  MD   ----------------------------------------- 1:39 PM on 02/27/2018 -----------------------------------------  Patient is resting comfortably.  Her father is at the bedside, patient reports she feels much better.  She is appearing much improved.  No ongoing evidence of allergic reaction or anaphylaxis.  Hemodynamics have normalized including her heart rate.  She is resting comfortably at this time.  Plan to observe her for a total of about 4 hours to assure no evidence of rebound anaphylaxis.  Patient reports that she used her last autoinjector, will refill this, also place her on prednisone and we discussed over-the-counter Benadryl for the next 2 days as well.  Patient and her father are in agreement.  Continue to observe her.  ----------------------------------------- 3:25 PM on 02/27/2018 -----------------------------------------  Patient being observed until about 4 PM.  Dr. Alphonzo Lemmings will follow up with her, the patient continues to do well anticipate discharge to home with renewal of her prescription for epinephrine as well as prednisone. ____________________________________________   FINAL CLINICAL IMPRESSION(S) / ED DIAGNOSES  Final diagnoses:  Anaphylaxis, initial encounter  Peanut allergy      NEW MEDICATIONS STARTED DURING THIS VISIT:  New Prescriptions   EPINEPHRINE 0.3 MG/0.3 ML IJ SOAJ INJECTION    Inject 0.3 mLs (0.3 mg total) into the muscle once for 1 dose.   PREDNISONE (DELTASONE) 20 MG TABLET    Take 2 tablets (40  mg total) by mouth daily with breakfast.     Note:  This document was prepared using Dragon voice recognition software and may include unintentional dictation errors.     Sharyn Creamer, MD 02/27/18 1525

## 2018-02-27 NOTE — ED Provider Notes (Signed)
-----------------------------------------   5:00 PM on 02/27/2018 -----------------------------------------  Patient signed out to me at 3:00 pending discharge, no signs or symptoms of anaphylaxis.  She is eager to go throat is clear no hives, extensive return present follow-up given and understood   Jeanmarie PlantMcShane, [name], MD 02/27/18 1701

## 2018-02-27 NOTE — ED Notes (Signed)
Pt reports decreased SOB.

## 2018-02-27 NOTE — ED Notes (Signed)
Pt reports feeling "much better". Family at bedside. No acute complaints voiced. Will continue to monitor.

## 2018-03-30 ENCOUNTER — Encounter: Payer: Self-pay | Admitting: Emergency Medicine

## 2018-03-30 ENCOUNTER — Emergency Department: Payer: BLUE CROSS/BLUE SHIELD

## 2018-03-30 ENCOUNTER — Emergency Department
Admission: EM | Admit: 2018-03-30 | Discharge: 2018-03-30 | Disposition: A | Discharge status: Home / Self Care | Payer: BLUE CROSS/BLUE SHIELD | Attending: Student in an Organized Health Care Education/Training Program | Admitting: Student in an Organized Health Care Education/Training Program

## 2018-03-30 DIAGNOSIS — Z79899 Other long term (current) drug therapy: Secondary | ICD-10-CM | POA: Insufficient documentation

## 2018-03-30 DIAGNOSIS — R1031 Right lower quadrant pain: Secondary | ICD-10-CM | POA: Insufficient documentation

## 2018-03-30 DIAGNOSIS — R079 Chest pain, unspecified: Secondary | ICD-10-CM | POA: Diagnosis not present

## 2018-03-30 DIAGNOSIS — Z91010 Allergy to peanuts: Secondary | ICD-10-CM | POA: Diagnosis not present

## 2018-03-30 LAB — COMPREHENSIVE METABOLIC PANEL
ALBUMIN: 4.3 g/dL (ref 3.5–5.0)
ALK PHOS: 49 U/L (ref 38–126)
ALT: 20 U/L (ref 14–54)
AST: 22 U/L (ref 15–41)
Anion gap: 4 — ABNORMAL LOW (ref 5–15)
BUN: 14 mg/dL (ref 6–20)
CALCIUM: 9 mg/dL (ref 8.9–10.3)
CHLORIDE: 108 mmol/L (ref 101–111)
CO2: 26 mmol/L (ref 22–32)
CREATININE: 0.75 mg/dL (ref 0.44–1.00)
GFR calc Af Amer: >60 mL/min (ref >60)
GFR calc non Af Amer: >60 mL/min (ref >60)
GLUCOSE: 100 mg/dL — AB (ref 65–99)
Potassium: 3.9 mmol/L (ref 3.5–5.1)
SODIUM: 138 mmol/L (ref 135–145)
Total Bilirubin: 0.5 mg/dL (ref 0.3–1.2)
Total Protein: 7.2 g/dL (ref 6.5–8.1)

## 2018-03-30 LAB — URINALYSIS, COMPLETE (UACMP) WITH MICROSCOPIC
Bilirubin Urine: NEGATIVE
GLUCOSE, UA: NEGATIVE mg/dL
HGB URINE DIPSTICK: NEGATIVE
Ketones, ur: NEGATIVE mg/dL
Leukocytes, UA: NEGATIVE
Nitrite: NEGATIVE
PROTEIN: NEGATIVE mg/dL
SPECIFIC GRAVITY, URINE: 1.006 (ref 1.005–1.030)
pH: 7 (ref 5.0–8.0)

## 2018-03-30 LAB — CBC
HCT: 38.4 % (ref 35.0–47.0)
Hemoglobin: 12.6 g/dL (ref 12.0–16.0)
MCH: 29.5 pg (ref 26.0–34.0)
MCHC: 32.9 g/dL (ref 32.0–36.0)
MCV: 89.9 fL (ref 80.0–100.0)
PLATELETS: 288 10*3/uL (ref 150–440)
RBC: 4.28 MIL/uL (ref 3.80–5.20)
RDW: 13.5 % (ref 11.5–14.5)
WBC: 5.3 10*3/uL (ref 3.6–11.0)

## 2018-03-30 LAB — LIPASE, BLOOD: LIPASE: 38 U/L (ref 11–51)

## 2018-03-30 LAB — FIBRIN DERIVATIVES D-DIMER (ARMC ONLY): Fibrin derivatives D-dimer (ARMC): 162.66 ng/mL (FEU) (ref 0.00–499.00)

## 2018-03-30 LAB — POCT PREGNANCY, URINE: PREG TEST UR: NEGATIVE

## 2018-03-30 MED ORDER — PROMETHAZINE HCL 25 MG/ML IJ SOLN
12.5000 mg | Freq: Four times a day (QID) | INTRAMUSCULAR | Status: DC | PRN
Start: 2018-03-30 — End: 2018-03-30
  Administered 2018-03-30: 12.5 mg via INTRAVENOUS
  Filled 2018-03-30: qty 1

## 2018-03-30 MED ORDER — LACTULOSE 10 G PO PACK: 10.0000 g | PACK | Freq: Three times a day (TID) | ORAL | 0 refills | Status: AC

## 2018-03-30 MED ORDER — DICYCLOMINE HCL 10 MG PO CAPS: 10.0000 mg | ORAL_CAPSULE | Freq: Three times a day (TID) | ORAL | 0 refills | Status: AC | PRN

## 2018-03-30 NOTE — ED Provider Notes (Signed)
[facility] Emergency Department Provider Note    First MD Initiated Contact with Patient 03/30/18 1417     (approximate)  I have reviewed the triage vital signs and the nursing notes.   HISTORY  Chief Complaint Abdominal Pain; Chest Pain; and Emesis    HPI Wendy Stone is a 22 y.o. female with a history of intermittent abdominal pain presents to the ER for about 1 week of intermittent severe right-sided abdominal pain that gets to the point where it causes her to have difficulty breathing and feeling short of breath.  States that he does have chest pain at this time and does have pain when taking deep inspiration.  She is on birth control.  Denies any fevers.  States she does have a history of constipation.  No dysuria or flank pain.  No hematuria.  No vaginal discharge.  Mother does have a history of endometriosis.  Denies any chance of being pregnant.  History reviewed. No pertinent past medical history. No family history on file. Past Surgical History:  Procedure Laterality Date  . adenoids    . TONSILLECTOMY     There are no active problems to display for this patient.     Prior to Admission medications   Medication Sig Start Date End Date Taking? Authorizing Provider  dicyclomine (BENTYL) 10 MG capsule Take 1 capsule (10 mg total) by mouth 3 (three) times daily as needed for up to 14 days for spasms. 03/30/18 04/13/18  Willy Eddy, MD  lactulose (CEPHULAC) 10 g packet Take 1 packet (10 g total) by mouth 3 (three) times daily. 03/30/18   Willy Eddy, MD  PARoxetine (PAXIL) 10 MG tablet Take 10 mg by mouth daily.    [provider]  predniSONE (DELTASONE) 20 MG tablet Take 2 tablets (40 mg total) by mouth daily with breakfast. 02/27/18   Sharyn Creamer, MD    Allergies Other; Peanut-containing drug products; Augmentin [amoxicillin-pot clavulanate]; Keflex [cephalexin]; and Meclizine    Social History Social History    Tobacco Use  . Smoking status: Never Smoker  . Smokeless tobacco: Never Used  Substance Use Topics  . Alcohol use: No  . Drug use: No    Review of Systems Patient denies headaches, rhinorrhea, blurry vision, numbness, shortness of breath, chest pain, edema, cough, abdominal pain, nausea, vomiting, diarrhea, dysuria, fevers, rashes or hallucinations unless otherwise stated above in HPI. ____________________________________________   PHYSICAL EXAM:  VITAL SIGNS: Vitals:   03/30/18 1248  BP: 118/69  Pulse: 76  Resp: 16  Temp: 97.6 F (36.4 C)  SpO2: 98%    Constitutional: Alert and oriented. Well appearing and in no acute distress. Eyes: Conjunctivae are normal.  Head: Atraumatic. Nose: No congestion/rhinnorhea. Mouth/Throat: Mucous membranes are moist.   Neck: No stridor. Painless ROM.  Cardiovascular: Normal rate, regular rhythm. Grossly normal heart sounds.  Good peripheral circulation. Respiratory: Normal respiratory effort.  No retractions. Lungs CTAB. Gastrointestinal: Soft and nontender. No distention. No abdominal bruits. No CVA tenderness. Genitourinary: deferred Musculoskeletal: No lower extremity tenderness nor edema.  No joint effusions. Neurologic:  Normal speech and language. No gross focal neurologic deficits are appreciated. No facial droop Skin:  Skin is warm, dry and intact. No rash noted. Psychiatric: Mood and affect are normal. Speech and behavior are normal.  ____________________________________________   LABS (all labs ordered are listed, but only abnormal results are displayed)  Results for orders placed or performed during the hospital encounter of 03/30/18 (from the past 24  hour(s))  Lipase, blood     Status: None   Collection Time: 03/30/18 12:51 PM  Result Value Ref Range   Lipase 38 11 - 51 U/L  Comprehensive metabolic panel     Status: Abnormal   Collection Time: 03/30/18 12:51 PM  Result Value Ref Range   Sodium 138 135 - 145 mmol/L    Potassium 3.9 3.5 - 5.1 mmol/L   Chloride 108 101 - 111 mmol/L   CO2 26 22 - 32 mmol/L   Glucose, Bld 100 (H) 65 - 99 mg/dL   BUN 14 6 - 20 mg/dL   Creatinine, Ser 1.61 0.44 - 1.00 mg/dL   Calcium 9.0 8.9 - 09.6 mg/dL   Total Protein 7.2 6.5 - 8.1 g/dL   Albumin 4.3 3.5 - 5.0 g/dL   AST 22 15 - 41 U/L   ALT 20 14 - 54 U/L   Alkaline Phosphatase 49 38 - 126 U/L   Total Bilirubin 0.5 0.3 - 1.2 mg/dL   GFR calc non Af Amer >60 >60 mL/min   GFR calc Af Amer >60 >60 mL/min   Anion gap 4 (L) 5 - 15  CBC     Status: None   Collection Time: 03/30/18 12:51 PM  Result Value Ref Range   WBC 5.3 3.6 - 11.0 K/uL   RBC 4.28 3.80 - 5.20 MIL/uL   Hemoglobin 12.6 12.0 - 16.0 g/dL   HCT 04.5 40.9 - 81.1 %   MCV 89.9 80.0 - 100.0 fL   MCH 29.5 26.0 - 34.0 pg   MCHC 32.9 32.0 - 36.0 g/dL   RDW 91.4 78.2 - 95.6 %   Platelets 288 150 - 440 K/uL  Urinalysis, Complete w Microscopic     Status: Abnormal   Collection Time: 03/30/18 12:51 PM  Result Value Ref Range   Color, Urine STRAW (A) YELLOW   APPearance CLEAR (A) CLEAR   Specific Gravity, Urine 1.006 1.005 - 1.030   pH 7.0 5.0 - 8.0   Glucose, UA NEGATIVE NEGATIVE mg/dL   Hgb urine dipstick NEGATIVE NEGATIVE   Bilirubin Urine NEGATIVE NEGATIVE   Ketones, ur NEGATIVE NEGATIVE mg/dL   Protein, ur NEGATIVE NEGATIVE mg/dL   Nitrite NEGATIVE NEGATIVE   Leukocytes, UA NEGATIVE NEGATIVE   RBC / HPF 0-5 0 - 5 RBC/hpf   WBC, UA 0-5 0 - 5 WBC/hpf   Bacteria, UA RARE (A) NONE SEEN   Squamous Epithelial / LPF 0-5 (A) NONE SEEN  Pregnancy, urine POC     Status: None   Collection Time: 03/30/18 12:57 PM  Result Value Ref Range   Preg Test, Ur NEGATIVE NEGATIVE  Fibrin derivatives D-Dimer ([facility] only)     Status: None   Collection Time: 03/30/18  2:48 PM  Result Value Ref Range   Fibrin derivatives D-dimer ([facility]) 162.66 0.00 - 499.00 ng/mL (FEU)   ____________________________________________  EKG My review and personal interpretation at Time:  12:49   Indication: chest pain  Rate: 80  Rhythm: sinus Axis: normal Other: normal intervals, no stemi, no brugada or wpw ____________________________________________  RADIOLOGY  I personally reviewed all radiographic images ordered to evaluate for the above acute complaints and reviewed radiology reports and findings.  These findings were personally discussed with the patient.  Please see medical record for radiology report.  ____________________________________________   PROCEDURES  Procedure(s) performed:  Procedures    Critical Care performed: no ____________________________________________   INITIAL IMPRESSION / ASSESSMENT AND PLAN / ED COURSE  Pertinent labs &  imaging results that were available during my care of the patient were reviewed by me and considered in my medical decision making (see chart for details).  DDX: Constipation, enteritis, IBD, IBS, torsion, PE, pericarditis  Wendy Stone is a 22 y.o. who presents to the ED with symptoms as described above.  Patient is well-appearing and in no acute distress.  At this time her abdominal exam is soft and benign.  She has no leukocytosis and no fever.  This does not seem clinically consistent with appendicitis particularly after 1 week of symptoms.  Patient does describe shortness of breath as well as pleuritic chest pain she is low risk by Wells criteria because she is on birth control will order d-dimer to further risk stratify for pulmonary embolism.  EKG shows no evidence of acute ischemia or pericarditis.  Will order x-ray to evaluate for obstructive pattern or stool burden.  Not clinically consistent with torsion or pelvic pathology.  Clinical Course as of Mar 30 1536  Tue Mar 30, 2018  1534 Patient reassessed.  D-dimer is negative.  Repeat abdominal exam is soft and benign.  Does show moderate stool burden in the location of the patient's discomfort.  Again I do not believe this consistent with appendicitis.  In  more likely consistent with constipation.  Will start empiric therapy with referral to GI.  Discussed signs and symptoms which she should return to the ER as well as return or be seen by medical provider in 12-24 hours if pain not improved.  Particular for any fevers.  Have discussed with the patient and available family all diagnostics and treatments performed thus far and all questions were answered to the best of my ability. The patient demonstrates understanding and agreement with plan.    [PR]    Clinical Course User Index [PR] Willy Eddyobinson, Chael Urenda, MD     As part of my medical decision making, I reviewed the following data within the electronic MEDICAL RECORD NUMBER Nursing notes reviewed and incorporated, Labs reviewed, notes from prior ED visits and [place] Controlled Substance Database   ____________________________________________   FINAL CLINICAL IMPRESSION(S) / ED DIAGNOSES  Final diagnoses:  Right lower quadrant abdominal pain  Chest pain, unspecified type      NEW MEDICATIONS STARTED DURING THIS VISIT:  New Prescriptions   DICYCLOMINE (BENTYL) 10 MG CAPSULE    Take 1 capsule (10 mg total) by mouth 3 (three) times daily as needed for up to 14 days for spasms.   LACTULOSE (CEPHULAC) 10 G PACKET    Take 1 packet (10 g total) by mouth 3 (three) times daily.     Note:  This document was prepared using Dragon voice recognition software and may include unintentional dictation errors.    Willy Eddyobinson, Phyllistine Domingos, MD 03/30/18 1537

## 2018-03-30 NOTE — Discharge Instructions (Signed)

## 2018-03-30 NOTE — ED Triage Notes (Signed)
Says pain right lower quad abd, into right lower back and chest pain that makes it hurt to breath on and off for a bout a week.

## 2018-08-02 ENCOUNTER — Other Ambulatory Visit: Payer: Self-pay

## 2018-08-02 ENCOUNTER — Encounter
Admission: RE | Admit: 2018-08-02 | Discharge: 2018-08-02 | Disposition: A | Discharge status: Home / Self Care | Payer: BLUE CROSS/BLUE SHIELD | Source: Ambulatory Visit | Attending: Orthopedic Surgery | Admitting: Orthopedic Surgery

## 2018-08-02 HISTORY — DX: Anxiety disorder, unspecified: F41.9

## 2018-08-02 MED ORDER — CLINDAMYCIN PHOSPHATE 900 MG/50ML IV SOLN
900.0000 mg | Freq: Once | INTRAVENOUS | Status: AC
  Administered 2018-08-03: 900 mg via INTRAVENOUS

## 2018-08-02 NOTE — Patient Instructions (Signed)
Your procedure is scheduled on: 08-03-18  Report to Same Day Surgery 2nd floor medical mall [facility](Medical Mall Entrance-take elevator on left to 2nd floor.  Check in with surgery information desk.) @ 10:30 AM PER PT   Remember: Instructions that are not followed completely may result in serious medical risk, up to and including death, or upon the discretion of your surgeon and anesthesiologist your surgery may need to be rescheduled.    _x___ 1. Do not eat food after midnight the night before your procedure. NO GUM OR CANDY AFTER MIDNIGHT.  You may drink clear liquids up to 2 hours before you are scheduled to arrive at the hospital for your procedure.  Do not drink clear liquids within 2 hours of your scheduled arrival to the hospital.  Clear liquids include  --Water or Apple juice without pulp  --Clear carbohydrate beverage such as ClearFast or Gatorade  --Black Coffee or Clear Tea (No milk, no creamers, do not add anything to the coffee or Tea   ____Ensure clear carbohydrate drink on the way to the hospital for bariatric patients  ____Ensure clear carbohydrate drink 3 hours before surgery for Dr Rutherford NailByrnett's patients if physician instructed.     __x__ 2. No Alcohol for 24 hours before or after surgery.   __x__3. No Smoking or e-cigarettes for 24 prior to surgery.  Do not use any chewable tobacco products for at least 6 hour prior to surgery   ____  4. Bring all medications with you on the day of surgery if instructed.    __x__ 5. Notify your doctor if there is any change in your medical condition     (cold, fever, infections).    x___6. On the morning of surgery brush your teeth with toothpaste and water.  You may rinse your mouth with mouth wash if you wish.  Do not swallow any toothpaste or mouthwash.   Do not wear jewelry, make-up, hairpins, clips or nail polish.  Do not wear lotions, powders, or perfumes. You may wear deodorant.  Do not shave 48 hours prior to surgery. Men may shave  face and neck.  Do not bring valuables to the hospital.    [facility] is not responsible for any belongings or valuables.               Contacts, dentures or bridgework may not be worn into surgery.  Leave your suitcase in the car. After surgery it may be brought to your room.  For patients admitted to the hospital, discharge time is determined by your treatment team.  _  Patients discharged the day of surgery will not be allowed to drive home.  You will need someone to drive you home and stay with you the night of your procedure.    Please read over the following fact sheets that you were given:   [facility] Preparing for [facility]   ____ Take anti-hypertensive listed below, cardiac, seizure, asthma,  anti-reflux and psychiatric medicines. These include:  1. NONE  2.  3.  4.  5.  6.  ____Fleets enema or Magnesium Citrate as directed.   _x___ Use CHG Soap or sage wipes as directed on instruction sheet   ____ Use inhalers on the day of surgery and bring to hospital day of surgery  ____ Stop Metformin and Janumet 2 days prior to surgery.    ____ Take 1/2 of usual insulin dose the night before surgery and none on the morning surgery.   ____ Follow recommendations from  Cardiologist, Pulmonologist or PCP regarding stopping Aspirin, Coumadin, Plavix ,Eliquis, Effient, or Pradaxa, and Pletal.  X____Stop Anti-inflammatories such as Advil, Aleve, Ibuprofen, Motrin, Naproxen, Naprosyn, Goodies powders or aspirin products NOW-OK to take Tylenol    ____ Stop supplements until after surgery.    ____ Bring C-Pap to the hospital.

## 2018-08-03 ENCOUNTER — Ambulatory Visit
Admission: RE | Admit: 2018-08-03 | Discharge: 2018-08-03 | Disposition: A | Discharge status: Home / Self Care | Payer: BLUE CROSS/BLUE SHIELD | Source: Ambulatory Visit | Attending: Orthopedic Surgery | Admitting: Orthopedic Surgery

## 2018-08-03 ENCOUNTER — Encounter
Admission: RE | Disposition: A | Discharge status: Home / Self Care | Payer: Self-pay | Source: Ambulatory Visit | Attending: Orthopedic Surgery

## 2018-08-03 ENCOUNTER — Other Ambulatory Visit: Payer: Self-pay

## 2018-08-03 ENCOUNTER — Ambulatory Visit: Payer: BLUE CROSS/BLUE SHIELD | Admitting: Anesthesiology

## 2018-08-03 DIAGNOSIS — S43431A Superior glenoid labrum lesion of right shoulder, initial encounter: Secondary | ICD-10-CM | POA: Diagnosis not present

## 2018-08-03 DIAGNOSIS — F419 Anxiety disorder, unspecified: Secondary | ICD-10-CM | POA: Insufficient documentation

## 2018-08-03 DIAGNOSIS — S46121A Laceration of muscle, fascia and tendon of long head of biceps, right arm, initial encounter: Secondary | ICD-10-CM | POA: Diagnosis not present

## 2018-08-03 DIAGNOSIS — X58XXXA Exposure to other specified factors, initial encounter: Secondary | ICD-10-CM | POA: Diagnosis not present

## 2018-08-03 DIAGNOSIS — M25311 Other instability, right shoulder: Secondary | ICD-10-CM | POA: Insufficient documentation

## 2018-08-03 DIAGNOSIS — M25511 Pain in right shoulder: Secondary | ICD-10-CM | POA: Diagnosis present

## 2018-08-03 DIAGNOSIS — Z79899 Other long term (current) drug therapy: Secondary | ICD-10-CM | POA: Insufficient documentation

## 2018-08-03 HISTORY — PX: SHOULDER ARTHROSCOPY WITH LABRAL REPAIR: SHX5691

## 2018-08-03 LAB — POCT PREGNANCY, URINE: PREG TEST UR: NEGATIVE

## 2018-08-03 SURGERY — ARTHROSCOPY, SHOULDER, WITH GLENOID LABRUM REPAIR
Anesthesia: General | Laterality: Right

## 2018-08-03 MED ORDER — PROMETHAZINE HCL 25 MG/ML IJ SOLN
INTRAMUSCULAR | Status: AC
  Administered 2018-08-03: 12.5 mg via INTRAVENOUS
  Filled 2018-08-03: qty 1

## 2018-08-03 MED ORDER — FENTANYL CITRATE (PF) 250 MCG/5ML IJ SOLN
INTRAMUSCULAR | Status: AC
  Filled 2018-08-03: qty 5

## 2018-08-03 MED ORDER — LIDOCAINE HCL (PF) 2 % IJ SOLN
INTRAMUSCULAR | Status: AC
  Filled 2018-08-03: qty 10

## 2018-08-03 MED ORDER — BUPIVACAINE LIPOSOME 1.3 % IJ SUSP
INTRAMUSCULAR | Status: AC
  Filled 2018-08-03: qty 20

## 2018-08-03 MED ORDER — PHENYLEPHRINE HCL 10 MG/ML IJ SOLN
INTRAMUSCULAR | Status: AC
  Filled 2018-08-03: qty 1

## 2018-08-03 MED ORDER — FAMOTIDINE 20 MG PO TABS
20.0000 mg | ORAL_TABLET | Freq: Once | ORAL | Status: AC
  Administered 2018-08-03: 20 mg via ORAL

## 2018-08-03 MED ORDER — ROCURONIUM BROMIDE 100 MG/10ML IV SOLN
INTRAVENOUS | Status: DC | PRN
  Administered 2018-08-03: 40 mg via INTRAVENOUS

## 2018-08-03 MED ORDER — LIDOCAINE HCL (PF) 1 % IJ SOLN
INTRAMUSCULAR | Status: AC
  Filled 2018-08-03: qty 5

## 2018-08-03 MED ORDER — ACETAMINOPHEN 10 MG/ML IV SOLN
INTRAVENOUS | Status: DC | PRN
  Administered 2018-08-03: 1000 mg via INTRAVENOUS

## 2018-08-03 MED ORDER — SODIUM CHLORIDE 0.9 % IV SOLN
INTRAVENOUS | Status: DC | PRN
  Administered 2018-08-03: 25 ug/min via INTRAVENOUS

## 2018-08-03 MED ORDER — FENTANYL CITRATE (PF) 100 MCG/2ML IJ SOLN
INTRAMUSCULAR | Status: AC
  Administered 2018-08-03: 25 ug via INTRAVENOUS
  Filled 2018-08-03: qty 2

## 2018-08-03 MED ORDER — DEXMEDETOMIDINE HCL IN NACL 80 MCG/20ML IV SOLN
INTRAVENOUS | Status: AC
  Filled 2018-08-03: qty 20

## 2018-08-03 MED ORDER — ONDANSETRON 4 MG PO TBDP: 4.0000 mg | ORAL_TABLET | Freq: Three times a day (TID) | ORAL | 0 refills | Status: AC | PRN

## 2018-08-03 MED ORDER — MIDAZOLAM HCL 2 MG/2ML IJ SOLN
INTRAMUSCULAR | Status: AC
  Filled 2018-08-03: qty 2

## 2018-08-03 MED ORDER — BUPIVACAINE HCL (PF) 0.5 % IJ SOLN
INTRAMUSCULAR | Status: DC | PRN
  Administered 2018-08-03: 10 mL via PERINEURAL

## 2018-08-03 MED ORDER — ONDANSETRON HCL 4 MG/2ML IJ SOLN
INTRAMUSCULAR | Status: AC
  Filled 2018-08-03: qty 2

## 2018-08-03 MED ORDER — ASPIRIN EC 325 MG PO TBEC: 325.0000 mg | DELAYED_RELEASE_TABLET | Freq: Every day | ORAL | 0 refills | Status: AC

## 2018-08-03 MED ORDER — FENTANYL CITRATE (PF) 100 MCG/2ML IJ SOLN
INTRAMUSCULAR | Status: AC
  Filled 2018-08-03: qty 2

## 2018-08-03 MED ORDER — SUGAMMADEX SODIUM 200 MG/2ML IV SOLN
INTRAVENOUS | Status: AC
  Filled 2018-08-03: qty 2

## 2018-08-03 MED ORDER — CLINDAMYCIN PHOSPHATE 900 MG/50ML IV SOLN
INTRAVENOUS | Status: AC
  Filled 2018-08-03: qty 50

## 2018-08-03 MED ORDER — LACTATED RINGERS IV SOLN
INTRAVENOUS | Status: DC
  Administered 2018-08-03 (×3): via INTRAVENOUS

## 2018-08-03 MED ORDER — PROPOFOL 10 MG/ML IV BOLUS
INTRAVENOUS | Status: DC | PRN
  Administered 2018-08-03: 150 mg via INTRAVENOUS

## 2018-08-03 MED ORDER — FENTANYL CITRATE (PF) 100 MCG/2ML IJ SOLN
INTRAMUSCULAR | Status: DC | PRN
  Administered 2018-08-03: 25 ug via INTRAVENOUS
  Administered 2018-08-03: 50 ug via INTRAVENOUS
  Administered 2018-08-03 (×2): 25 ug via INTRAVENOUS
  Administered 2018-08-03: 50 ug via INTRAVENOUS
  Administered 2018-08-03: 25 ug via INTRAVENOUS
  Administered 2018-08-03 (×3): 50 ug via INTRAVENOUS

## 2018-08-03 MED ORDER — DEXAMETHASONE SODIUM PHOSPHATE 10 MG/ML IJ SOLN
INTRAMUSCULAR | Status: DC | PRN
  Administered 2018-08-03: 10 mg via INTRAVENOUS

## 2018-08-03 MED ORDER — LIDOCAINE HCL (CARDIAC) PF 100 MG/5ML IV SOSY
PREFILLED_SYRINGE | INTRAVENOUS | Status: DC | PRN
  Administered 2018-08-03: 50 mg via INTRAVENOUS

## 2018-08-03 MED ORDER — LIDOCAINE HCL (PF) 1 % IJ SOLN
INTRAMUSCULAR | Status: DC | PRN
  Administered 2018-08-03: 5 mL via SUBCUTANEOUS

## 2018-08-03 MED ORDER — BUPIVACAINE HCL (PF) 0.5 % IJ SOLN
INTRAMUSCULAR | Status: AC
  Filled 2018-08-03: qty 10

## 2018-08-03 MED ORDER — EPHEDRINE SULFATE 50 MG/ML IJ SOLN
INTRAMUSCULAR | Status: DC | PRN
  Administered 2018-08-03: 10 mg via INTRAVENOUS
  Administered 2018-08-03: 5 mg via INTRAVENOUS

## 2018-08-03 MED ORDER — ACETAMINOPHEN 10 MG/ML IV SOLN
INTRAVENOUS | Status: AC
  Filled 2018-08-03: qty 100

## 2018-08-03 MED ORDER — ROCURONIUM BROMIDE 50 MG/5ML IV SOLN
INTRAVENOUS | Status: AC
  Filled 2018-08-03: qty 1

## 2018-08-03 MED ORDER — PHENYLEPHRINE HCL 10 MG/ML IJ SOLN
INTRAMUSCULAR | Status: DC | PRN
  Administered 2018-08-03: 100 ug via INTRAVENOUS
  Administered 2018-08-03: 50 ug via INTRAVENOUS
  Administered 2018-08-03: 200 ug via INTRAVENOUS
  Administered 2018-08-03 (×2): 100 ug via INTRAVENOUS
  Administered 2018-08-03: 200 ug via INTRAVENOUS
  Administered 2018-08-03 (×3): 50 ug via INTRAVENOUS
  Administered 2018-08-03 (×2): 100 ug via INTRAVENOUS
  Administered 2018-08-03 (×2): 50 ug via INTRAVENOUS

## 2018-08-03 MED ORDER — SODIUM CHLORIDE FLUSH 0.9 % IV SOLN
INTRAVENOUS | Status: AC
  Filled 2018-08-03: qty 10

## 2018-08-03 MED ORDER — MIDAZOLAM HCL 2 MG/2ML IJ SOLN
INTRAMUSCULAR | Status: DC | PRN
  Administered 2018-08-03: 1 mg via INTRAVENOUS

## 2018-08-03 MED ORDER — ACETAMINOPHEN 500 MG PO TABS: 1000.0000 mg | ORAL_TABLET | Freq: Three times a day (TID) | ORAL | 2 refills | Status: AC

## 2018-08-03 MED ORDER — ONDANSETRON HCL 4 MG/2ML IJ SOLN
INTRAMUSCULAR | Status: DC | PRN
  Administered 2018-08-03: 4 mg via INTRAVENOUS

## 2018-08-03 MED ORDER — DEXMEDETOMIDINE HCL 200 MCG/2ML IV SOLN
INTRAVENOUS | Status: DC | PRN
  Administered 2018-08-03 (×2): 12 ug via INTRAVENOUS
  Administered 2018-08-03 (×2): 8 ug via INTRAVENOUS

## 2018-08-03 MED ORDER — SEVOFLURANE IN SOLN
RESPIRATORY_TRACT | Status: AC
  Filled 2018-08-03: qty 250

## 2018-08-03 MED ORDER — BUPIVACAINE LIPOSOME 1.3 % IJ SUSP
INTRAMUSCULAR | Status: DC | PRN
  Administered 2018-08-03: 20 mL via PERINEURAL

## 2018-08-03 MED ORDER — FENTANYL CITRATE (PF) 100 MCG/2ML IJ SOLN
25.0000 ug | INTRAMUSCULAR | Status: DC | PRN
  Administered 2018-08-03 (×2): 25 ug via INTRAVENOUS

## 2018-08-03 MED ORDER — OXYCODONE HCL 5 MG PO TABS: 5.0000 mg | ORAL_TABLET | ORAL | 0 refills | Status: AC | PRN

## 2018-08-03 MED ORDER — MIDAZOLAM HCL 2 MG/2ML IJ SOLN
1.0000 mg | Freq: Once | INTRAMUSCULAR | Status: AC
  Administered 2018-08-03: 1 mg via INTRAVENOUS

## 2018-08-03 MED ORDER — PROMETHAZINE HCL 25 MG/ML IJ SOLN
6.2500 mg | INTRAMUSCULAR | Status: DC | PRN
  Administered 2018-08-03: 12.5 mg via INTRAVENOUS

## 2018-08-03 MED ORDER — EPINEPHRINE PF 1 MG/ML IJ SOLN
INTRAMUSCULAR | Status: DC | PRN
  Administered 2018-08-03: 11 mL

## 2018-08-03 MED ORDER — DEXAMETHASONE SODIUM PHOSPHATE 10 MG/ML IJ SOLN
INTRAMUSCULAR | Status: AC
  Filled 2018-08-03: qty 1

## 2018-08-03 MED ORDER — FAMOTIDINE 20 MG PO TABS
ORAL_TABLET | ORAL | Status: AC
  Administered 2018-08-03: 20 mg via ORAL
  Filled 2018-08-03: qty 1

## 2018-08-03 MED ORDER — FENTANYL CITRATE (PF) 100 MCG/2ML IJ SOLN
INTRAMUSCULAR | Status: AC
  Administered 2018-08-03: 50 ug via INTRAVENOUS
  Filled 2018-08-03: qty 2

## 2018-08-03 MED ORDER — MIDAZOLAM HCL 2 MG/2ML IJ SOLN
INTRAMUSCULAR | Status: AC
  Administered 2018-08-03: 1 mg via INTRAVENOUS
  Filled 2018-08-03: qty 2

## 2018-08-03 MED ORDER — FENTANYL CITRATE (PF) 100 MCG/2ML IJ SOLN
50.0000 ug | Freq: Once | INTRAMUSCULAR | Status: AC
  Administered 2018-08-03: 50 ug via INTRAVENOUS

## 2018-08-03 SURGICAL SUPPLY — 83 items
ADAPTER IRRIG TUBE 2 SPIKE SOL (ADAPTER) ×6 IMPLANT
ANCHOR SUT 1.8 FBRTK KNTLS 2SU (Anchor) ×6 IMPLANT
ANCHOR SUT BIOCOMP LK 2.9X12.5 (Anchor) ×15 IMPLANT
ANCHOR SUT FBRTK SUTURETAP 1.3 (Anchor) ×3 IMPLANT
BIT DRILL RIGD1.8MM FBRTK STRL (DRILL) ×1 IMPLANT
BRUSH SCRUB EZ  4% CHG (MISCELLANEOUS) ×2
BRUSH SCRUB EZ 4% CHG (MISCELLANEOUS) ×1 IMPLANT
BUR RADIUS 4.0X18.5 (BURR) IMPLANT
BUR RADIUS 5.5 (BURR) IMPLANT
CANNULA 5.75X7 CRYSTAL CLEAR (CANNULA) ×3 IMPLANT
CANNULA 5.75X7CM (CANNULA) ×2
CANNULA PART THRD DISP 5.75X7 (CANNULA) ×4 IMPLANT
CANNULA PARTIAL THREAD 2X7 (CANNULA) ×6 IMPLANT
CANNULA TWIST IN 8.25X9CM (CANNULA) ×6 IMPLANT
CHLORAPREP W/TINT 26ML (MISCELLANEOUS) ×3 IMPLANT
CLOSURE WOUND 1/2 X4 (GAUZE/BANDAGES/DRESSINGS) ×1
COOLER POLAR GLACIER W/PUMP (MISCELLANEOUS) ×3 IMPLANT
CRADLE LAMINECT ARM (MISCELLANEOUS) ×6 IMPLANT
CUTTER BONE 4.0MM X 13CM (MISCELLANEOUS) ×3 IMPLANT
DERMABOND ADVANCED (GAUZE/BANDAGES/DRESSINGS) ×4
DERMABOND ADVANCED .7 DNX12 (GAUZE/BANDAGES/DRESSINGS) ×2 IMPLANT
DRAPE IMP U-DRAPE 54X76 (DRAPES) ×6 IMPLANT
DRAPE INCISE IOBAN 66X45 STRL (DRAPES) ×3 IMPLANT
DRAPE SHEET LG 3/4 BI-LAMINATE (DRAPES) ×3 IMPLANT
DRILL RIGID 1.8MM FBRTK STRL (DRILL) ×3
DW OUTFLOW CASSETTE/TUBE SET (MISCELLANEOUS) ×3 IMPLANT
ELECT REM PT RETURN 9FT ADLT (ELECTROSURGICAL) ×3
ELECTRODE REM PT RTRN 9FT ADLT (ELECTROSURGICAL) ×1 IMPLANT
GAUZE PETRO XEROFOAM 1X8 (MISCELLANEOUS) ×3 IMPLANT
GAUZE SPONGE 4X4 12PLY STRL (GAUZE/BANDAGES/DRESSINGS) ×3 IMPLANT
GLOVE BIOGEL PI IND STRL 8 (GLOVE) ×5 IMPLANT
GLOVE BIOGEL PI INDICATOR 8 (GLOVE) ×10
GLOVE SURG SYN 7.5  E (GLOVE) ×10
GLOVE SURG SYN 7.5 E (GLOVE) ×5 IMPLANT
GOWN STRL REUS W/ TWL LRG LVL3 (GOWN DISPOSABLE) ×2 IMPLANT
GOWN STRL REUS W/TWL LRG LVL3 (GOWN DISPOSABLE) ×4
GOWN STRL REUS W/TWL LRG LVL4 (GOWN DISPOSABLE) ×6 IMPLANT
IV LACTATED RINGER IRRG 3000ML (IV SOLUTION) ×16
IV LR IRRIG 3000ML ARTHROMATIC (IV SOLUTION) ×8 IMPLANT
KIT CVD SPEAR FBRTK 1.8 DRILL (KITS) ×3 IMPLANT
KIT INSERTION 2.9 PUSHLOCK (KITS) ×3 IMPLANT
KIT SUTURETAK 3.0 INSERT PERC (KITS) IMPLANT
KIT TURNOVER KIT A (KITS) ×3 IMPLANT
Knotless FiberTak Disposable Kit Curved ×3 IMPLANT
LASSO 25 DEG RIGHT QUICKPASS (SUTURE) ×3 IMPLANT
MANIFOLD NEPTUNE II (INSTRUMENTS) ×3 IMPLANT
MASK FACE SPIDER DISP (MASK) ×3 IMPLANT
MAT ABSORB  FLUID 56X50 GRAY (MISCELLANEOUS) ×4
MAT ABSORB FLUID 56X50 GRAY (MISCELLANEOUS) ×2 IMPLANT
NDL SAFETY ECLIPSE 18X1.5 (NEEDLE) ×1 IMPLANT
NEEDLE HYPO 18GX1.5 SHARP (NEEDLE) ×2
NEEDLE HYPO 22GX1.5 SAFETY (NEEDLE) ×3 IMPLANT
PACK ARTHROSCOPY SHOULDER (MISCELLANEOUS) ×3 IMPLANT
PAD ABD DERMACEA PRESS 5X9 (GAUZE/BANDAGES/DRESSINGS) ×3 IMPLANT
PAD WRAPON POLAR SHDR XLG (MISCELLANEOUS) ×1 IMPLANT
PORT APPOLLO RF 90DEGREE MULTI (SURGICAL WAND) ×3 IMPLANT
SET ADAPTER IRRIG ARTHO 72IN Y (ADAPTER) ×3 IMPLANT
SET TUBE SUCT SHAVER OUTFL 24K (TUBING) IMPLANT
SET TUBE TIP INTRA-ARTICULAR (MISCELLANEOUS) IMPLANT
SLING ULTRA II M (MISCELLANEOUS) ×3 IMPLANT
STRAP SAFETY 5IN WIDE (MISCELLANEOUS) ×3 IMPLANT
STRAP SPIDER RETRACTOR (MISCELLANEOUS) ×3 IMPLANT
STRIP CLOSURE SKIN 1/2X4 (GAUZE/BANDAGES/DRESSINGS) ×2 IMPLANT
SUT ETHILON 4-0 (SUTURE) ×2
SUT ETHILON 4-0 FS2 18XMFL BLK (SUTURE) ×1
SUT LASSO 90 DEG SD STR (SUTURE) IMPLANT
SUT MNCRL 4-0 (SUTURE) ×2
SUT MNCRL 4-0 27XMFL (SUTURE) ×1
SUTURE ETHLN 4-0 FS2 18XMF BLK (SUTURE) ×1 IMPLANT
SUTURE MNCRL 4-0 27XMF (SUTURE) ×1 IMPLANT
SUTURE TAPE 1.3 40 TPR END (SUTURE) ×2 IMPLANT
SUTURETAPE 1.3 40 TPR END (SUTURE) ×6
SYR 10ML LL (SYRINGE) ×3 IMPLANT
TAPE CLOTH 3X10 WHT NS LF (GAUZE/BANDAGES/DRESSINGS) ×3 IMPLANT
TAPE MICROFOAM 4IN (TAPE) ×3 IMPLANT
TAPE SUT LABRALTAP WHT/BLK (SUTURE) ×15 IMPLANT
TUBING ARTHRO INFLOW-ONLY STRL (TUBING) IMPLANT
TUBING CONNECTING 10 (TUBING) IMPLANT
TUBING CONNECTING 10' (TUBING)
TUBING REDEUCE PAT W/CON 8IN (MISCELLANEOUS) ×3 IMPLANT
TUBING REDEUCE PUMP W/CON 8IN (MISCELLANEOUS) ×3 IMPLANT
WAND HAND CNTRL MULTIVAC 90 (MISCELLANEOUS) IMPLANT
WRAPON POLAR PAD SHDR XLG (MISCELLANEOUS) ×3

## 2018-08-03 NOTE — Anesthesia Procedure Notes (Signed)
Anesthesia Regional Block: Interscalene brachial plexus block   Pre-Anesthetic Checklist: ,, timeout performed, Correct Patient, Correct Site, Correct Laterality, Correct Procedure, Correct Position, site marked, Risks and benefits discussed,  Surgical consent,  Pre-op evaluation,  At surgeon's request and post-op pain management  Laterality: Right and Upper  Prep: chloraprep       Needles:  Injection technique: Single-shot  Needle Type: Stimiplex     Needle Length: 5cm  Needle Gauge: 22     Additional Needles:   Procedures:,,,, ultrasound used (permanent image in chart),,,,  Narrative:  Start time: 08/03/2018 12:15 PM End time: 08/03/2018 12:22 PM Injection made incrementally with aspirations every 5 mL.  Performed by: Personally  Anesthesiologist: Lenard SimmerKarenz, Kadyn Chovan, MD  Additional Notes: Functioning IV was confirmed and monitors were applied.  A 50mm 22ga Stimuplex needle was used. Sterile prep and drape,hand hygiene and sterile gloves were used.  Negative aspiration and negative test dose prior to incremental administration of local anesthetic. The patient tolerated the procedure well.

## 2018-08-03 NOTE — Discharge Instructions (Signed)
Interscalene Nerve Block with Exparel  1.  For your surgery you have received an Interscalene Nerve Block with Exparel. 2. Nerve Blocks affect many types of nerves, including nerves that control movement, pain and normal sensation.  You may experience feelings such as numbness, tingling, heaviness, weakness or the inability to move your arm or the feeling or sensation that your arm has "fallen asleep". 3. A nerve block with Exparel can last up to 5 days.  Usually the weakness wears off first.  The tingling and heaviness usually wear off next.  Finally you may start to notice pain.  Keep in mind that this may occur in any order.  Once a nerve block starts to wear off it is usually completely gone within 60 minutes. 4. ISNB may cause mild shortness of breath, a hoarse voice, blurry vision, unequal pupils, or drooping of the face on the same side as the nerve block.  These symptoms will usually resolve with the numbness.  Very rarely the procedure itself can cause mild seizures. 5. If needed, your surgeon will give you a prescription for pain medication.  It will take about 60 minutes for the oral pain medication to become fully effective.  So, it is recommended that you start taking this medication before the nerve block first begins to wear off, or when you first begin to feel discomfort. 6. Take your pain medication only as prescribed.  Pain medication can cause sedation and decrease your breathing if you take more than you need for the level of pain that you have. 7. Nausea is a common side effect of many pain medications.  You may want to eat something before taking your pain medicine to prevent nausea. 8. After an Interscalene nerve block, you cannot feel pain, pressure or extremes in temperature in the effected arm.  Because your arm is numb it is at an increased risk for injury.  To decrease the possibility of injury, please practice the following:  a. While you are awake change the position of  your arm frequently to prevent too much pressure on any one area for prolonged periods of time. b.  If you have a cast or tight dressing, check the color or your fingers every couple of hours.  Call your surgeon with the appearance of any discoloration (white or blue). c. If you are given a sling to wear before you go home, please wear it  at all times until the block has completely worn off.  Do not get up at night without your sling. d. Please contact [facility] or your surgeon if you do not begin to regain sensation after 7 days from the surgery.  Anesthesia may be contacted by calling the Same Day Surgery Department, Mon. through Fri., 6 am to 4 pm at [phone].   e. If you experience any other problems or concerns, please contact your surgeon's office. If you experience severe or prolonged shortness of breath go to the nearest emergency department.Post-Op Instructions  1. Bracing: You will wear a shoulder immobilizer or sling for at least 3 weeks. Total time will be determined by type of surgical repair and can be discussed at 1st postop appointment.  2. Driving: No driving for 4 weeks post-op. When driving, do not wear the immobilizer.   3. Activity: No active lifting for 2 months. Wrist, hand, and elbow motion only. Avoid lifting the upper arm away from the body except for hygiene. You are permitted to bend and straighten the elbow actively. You  may use your hand and wrist for typing, writing, and managing utensils (cutting food). Do not lift more than a coffee cup for 8 weeks.  When sleeping or resting, inclined positions (recliner chair or wedge pillow) and a pillow under the forearm for support may provide better comfort for up to 4 weeks.  Avoid long distance travel for 4 weeks.  Return to normal activities after labral repair normally takes 6 months on average. If rehab goes very well, may be able to do most activities at 4 months, except overhead or contact sports.  4. Physical  Therapy: Begins 3-4 days after surgery, and proceeds 2 times per week for the first 4 weeks, then 1-2 times per week from weeks 4-8 post-op.  5. Medications:  - You will be provided a prescription for narcotic pain medicine. After surgery, take 1-2 narcotic tablets every 4 hours if needed for severe pain.  - A prescription for anti-nausea medication will be provided in case the narcotic medicine causes nausea - take 1 tablet every 6 hours only if nauseated.   - Take tylenol 1000 mg every 8 hours for pain.  May stop tylenol when you are having minimal pain.  If you are taking prescription medication for anxiety, depression, insomnia, muscle spasm, chronic pain, or for attention deficit disorder, you are advised that you are at a higher risk of adverse effects with use of narcotics post-op, including narcotic addiction/dependence, depressed breathing, death. If you use non-prescribed substances: alcohol, marijuana, cocaine, heroin, methamphetamines, etc., you are at a higher risk of adverse effects with use of narcotics post-op, including narcotic addiction/dependence, depressed breathing, death. You are advised that taking > 50 morphine milligram equivalents (MME) of narcotic pain medication per day results in twice the risk of overdose or death. For your prescription provided: oxycodone 5 mg - taking more than 6 tablets per day would result in > 50 morphine milligram equivalents (MME) of narcotic pain medication. Be advised that we will prescribe narcotics short-term, for acute post-operative pain only - 3 weeks for major operations such as shoulder repair/reconstruction surgeries.   6. Post-Op Appointment:  Your first post-op appointment will be 10-14 days post-op.  7. Work or School: For most, but not all procedures, we advise staying out of work or school for at least 1 to 2 weeks in order to recover from the stress of surgery and to allow time for healing.   If you need a work or school note  this can be provided.   Post-operative Brace: Apply and remove the brace you received as you were instructed to at the time of fitting and as described in detail as the braces instructions for use indicate.  Wear the brace for the period of time prescribed by your physician.  The brace can be cleaned with soap and water and allowed to air dry only.  Should the brace result in increased pain, decreased feeling (numbness/tingling), increased swelling or an overall worsening of your medical condition, please contact your doctor immediately.  If an emergency situation occurs as a result of wearing the brace after normal business hours, please dial 911 and seek immediate medical attention.  Let your doctor know if you have any further questions about the brace issued to you. Refer to the shoulder sling instructions for use if you have any questions regarding the correct fit of your shoulder sling.  [facility] Customer Care for Troubleshooting: [phone]  Video that illustrates how to properly use a shoulder sling: "Instructions for Proper  Use of an Orthopaedic Sling" http://bass.com/https://www.youtube.com/watch?v=[email or web address]_Xo45w

## 2018-08-03 NOTE — Anesthesia Postprocedure Evaluation (Signed)
Anesthesia Post Note  Patient: Wendy Stone  Procedure(s) Performed: SHOULDER ARTHROSCOPY WITH LABRAL REPAIR AND CAPSULAR SHIFT, ARTHROSCOPIC DISTAL CLAVICLE EXCISION SUBPECTORAL BICEPS TENODESIS (Right )  Patient location during evaluation: PACU Anesthesia Type: General Level of consciousness: awake and alert Pain management: pain level controlled Vital Signs Assessment: post-procedure vital signs reviewed and stable Respiratory status: spontaneous breathing, nonlabored ventilation, respiratory function stable and patient connected to nasal cannula oxygen Cardiovascular status: blood pressure returned to baseline and stable Postop Assessment: no apparent nausea or vomiting Anesthetic complications: no     Last Vitals:  Vitals:   08/03/18 2050 08/03/18 2100  BP: (!) 83/39 (!) 89/41  Pulse: 95 97  Resp: 16   Temp:  (!) 36.2 C  SpO2: 99% 98%    Last Pain:  Vitals:   08/03/18 2100  TempSrc:   PainSc: 0-No pain                 Cleda MccreedyJoseph K Laylani Pudwill

## 2018-08-03 NOTE — H&P (Signed)
Paper H&P to be scanned into permanent record. H&P reviewed. No significant changes noted.  

## 2018-08-03 NOTE — Anesthesia Post-op Follow-up Note (Signed)
Anesthesia QCDR form completed.        

## 2018-08-03 NOTE — Anesthesia Procedure Notes (Signed)
Procedure Name: Intubation Date/Time: 08/03/2018 12:50 PM Performed by: Willette Alma, CRNA Pre-anesthesia Checklist: Patient identified, Patient being monitored, Timeout performed, Emergency Drugs available and Suction available Patient Re-evaluated:Patient Re-evaluated prior to induction Oxygen Delivery Method: Circle system utilized Preoxygenation: Pre-oxygenation with 100% oxygen Induction Type: IV induction Ventilation: Mask ventilation without difficulty Laryngoscope Size: Mac and 3 Grade View: Grade I Tube type: Oral Tube size: 7.0 mm Number of attempts: 1 Airway Equipment and Method: Stylet Placement Confirmation: ETT inserted through vocal cords under direct vision,  positive ETCO2 and breath sounds checked- equal and bilateral Secured at: 21 cm Tube secured with: Tape Dental Injury: Teeth and Oropharynx as per pre-operative assessment

## 2018-08-03 NOTE — Op Note (Addendum)
DATE: 08/03/2018   PRE-OP DIAGNOSIS:  1. Right shoulder posterior/superior labral tear 2. Right shoulder biceps tendon tear 3. Right shoulder multidirectional instability   POST-OP DIAGNOSIS:  1. Right shoulder posterior/superior labral tear 2. Right shoulder biceps tendon tear 3. Right shoulder multidirectional instability   PROCEDURES:  1. Right shoulder arthroscopic anterior and posterior labral repair and capsulorraphy 2. Right open subpectoral biceps tenodesis 3. Right shoulder extensive glenohumeral debridement   SURGEON: Rosealee AlbeeSunny H. Audria Takeshita, MD   ASSISTANT: Sonny DandyJ. Todd Mundy, PA  ANESTHESIA: Regional    TOTAL IV FLUIDS: per anesthesia record   ESTIMATED BLOOD LOSS: 10cc   DRAINS: None    SPECIMENS: None.    IMPLANTS:  Arthrex 2.389mm BioComposite PushLock Suture Anchors x 5 Arthrex 1.688mm Knotless FiberTak Sutures x 2 Arthrex Double loaded Public relations account executiveiberTak Suture Anchor - x1   COMPLICATIONS: None    Indications:  Wendy Stone is a 22 y.o. female with a history of shoulder pain for ~3-4 years. She has had 2 episodes of shoulder dislocation that she was able to self-reduce. MRI showed superior/posterior labral tear, split-tearing of the biceps tendon extending from near the anchor to the extraarticular portion, as well as a patulous anterior and posterior capsule. The patient has failed non-operative management in the form of appropriate physical therapy, medications, activity modifications, and corticosteroid injection. After discussion of risks, benefits, and alternatives to surgery, the patient elected to proceed.  We did discuss that due to her hyper laxity and multidirectional instability, she is at high risk of possible redislocation in the future.   Procedure Details  The patient was seen in the Holding Room. The risks, benefits, complications, treatment options, and expected outcomes were discussed with the patient. The risks and potential complications of the problem and  proposed treatment were discussed. This includes but is not limited to failure to fully relieve pain, continued or recurrence of pain,, infection, neurovascular compromise, complications from anesthesia, stiff shoulder, bleeding, DVT, and reoperation. The patient concurred with the proposed plan, giving informed consent. The site of surgery was properly noted/marked.   The patient was placed on the OR bed in the beach chair position using a beanbag. All bony prominences were well padded. The patient was given appropriate preoperative IV antibiotics. The upper extremity was prescrubbed with alcohol, prepped with Chloroprep, and draped in the usual sterile fashion. A Time Out Procedure was performed confirming correct patient, procedure, and laterality.   Exam under anesthesia showed: laxity anterior 2+, posterior 2+, 2+ sulcus that was reduced to 1+ with ER. 170FF, 95 ER with arm at side, with arm abducted: ER of 120, IR of 90   Glenohumeral portals were marked and injected with dilute epinephrine in lidocaine. An 11 blade was used to make stab incisions in the posterior soft spot for the standard posterior viewing portal. I made an additional high anterior portal, which was used as our anterior viewing portal. We performed a thorough arthroscopic evaluation of the glenohumeral joint through both the anterior and posterior viewing portals.    Glenohumeral Joint: Articular cartilage of the humeral head and glenoid: mild fraying of the glenoid and humeral head Synovium: injected and erythematous Anterior Labrum: diminutive with sublabral foramen Posterior Labrum: posterior labral tear extending from superior to ~9-10:30 o'clock position Superior Labrum: normal Inferior Labrum: diminutive Anterior Capsule: patulous  Inferior Capsule: patulous  Posterior Capsule: patulous  Rotator interval: normal  Superior glenohumeral ligament: normal  Middle glenohumeral ligament: normal  Inferior glenohumeral  ligament: normal. No HAGL. Biceps:  erythema near insertion onto superior labrum, split tear Rotator cuff findings: normal subscapularis, supraspinatus, infraspinatus.   The biceps tendon was cut using arthroscopic scissors. The stump of the proximal biceps was debrided with a shaver. The frayed edges of the superior labrum and posterior labrum were debrided with a shaver. Similarly, cartilaginous surfaces of the humeral head and glenoid were debrided where there were frayed edges. The injected synovium was debrided with a combination of shaver and arthrocare wand. We then turned our attention to performing the subpectoral biceps tenodesis. I made an ~5cm incision parallel to the skin relaxation lines along the medial upper arm, such that 1/3 of the incision was above the pectoralis major tendon and 2/3 below it. Sharp dissection was carried down to the fascia overlying the pectoralis and the short head of the biceps. Bovie electrocautery was used to achieve hemostasis. I made a vertical nick in the fascia in line with the short head of the biceps. The pectoralis tendon was then retracted with a large Hohmann retractor. An [employer] retractor was used to retract inferiorly. This afforded excellent visualization of the long head of the biceps tendon. It was delivered out of the incision. I then placed a double-loaded Arthrex FiberTak all-suture anchor at the most proximal and central portion of the inter-tubercular groove that I could visualize. I took one set of tapes and with the use of a free needle, passed one strand through the biceps tendon once and passed the other strand through the biceps in a locking fashion. Suture tapes were passed through the biceps tendon at the level of the musculocutaneous junction. This was also done for the other tape as well. This configuration allowed me to then parachute the tendon back into the wound as the two ends of the suture tape were tied together with a Surgeon's knot.  This was repeated for the other strand of suture tape. The excess proximal tendon was sharply excised. The tension in the tenodesed long head was excellent. The wound was thoroughly irrigated with saline, and the incision closed in layers using 3-0 Vicryl for the deep dermis and a running subcuticular 4-0 Monocryl for skin. A thin layer of Dermabond was applied.    I then placed the arthroscope back into the posterior portal.  An accessory inferior anterior portal just superior to the subscapularis and coming in from a lateral to medial direction was made as a working portal. A 7 mm cannula was placed in the portal. A rasp and shaver were used to roughen capsule for improvement of healing.  A curved drill guide for a knotless fiber tack was placed at the 5:30 position while applying a posterior force to the humerus.  The Arthrex suture lasso was used to shift the capsule from inferior to superior direction.  Sutures were shuttled appropriately and the repair was cinched down.  Two additional anterior anchors were placed at the 4:00 and 3:00 positions.  An appropriate anterior capsulorrhaphy with inferior to superior shift was performed with these 2 anchors. 2.9 mm PushLocks were used for the 4:00 and 3:00 anchors. This nicely tightened the labrum onto the glenoid as a bumper and tightened the capsule. The suture tails were cut flush with the articular cartilage. The repair was stable to probing.   The elbow was placed in a position of slight anterior and inferior traction to allow for improved visualization. Next, a portal of [place] was made along the posterior 1/3 of the acromion just inferior to the lateral edge using  a spinal needle to confirm appropriate positioning.  A 7mm cannula was placed in the portal of [place]. An elevator was used to elevate the torn portion of the posterior labrum off the glenoid posteriorly in the affected regions described above. A rasp and shaver were used to roughen the  glenoid for improvement of healing.    We began by placing a knotless fiber tack at the 6:30 position while applying a a gentle anterior and distal force to the humerus.  The Arthrex suture lasso was used to shift the capsule from inferior to superior direction.  Sutures were shuttled appropriately and the repair was cinched down.  Three additional 2.9 mm push locks were placed at the 7:30, 9:00, and 10:00 positions in the manner described above for the anterior repair. This nicely tightened the labrum onto the glenoid as a bumper and tightened the capsule. The suture tails were cut flush with the articular cartilage. The repair was stable to probing.   Lastly, the posterior capsule was closed by passing two suture tapes with a [place] across the capsular hole and tied arthroscopically.  This nicely reapproximated the posterior capsule in the area of the portals.  Arthroscopic debris was removed from the joint with a shaver and fluid was evacuated from the shoulder. The portals were closed with 3-0 nylon.  Additionally, the superior aspect of the biceps tenodesis incision became open through the case and additional 3-0 nylon sutures were used to close this incision as well.  Xeroform gauze and sterile dressings were applied. Patient was placed in a shoulder immobilizer and Polar Care.  She was then awakened from anesthesia and transferred to the PACU without any complication.  Of note, assistance from a PA was essential to performing the surgery. PA assisted with patient positioning, retraction, and instrumentation. The surgery would have been more difficult and had longer operative time without PA assistance.   Additionally, the surgery required more time and surgeon effort and intensity than the standard labral repair and capsulorrhaphy due to the patient's multidirectional instability and extremely patulous capsules both anteriorly and posteriorly requiring an anterior, inferior, and posterior repair and  capsulorrhaphy.  I estimate that this essentially doubled the operative time and effort to achieve the final result.    POSTOPERATIVE PLAN:  Patient will be discharged to home.  Physical therapy 3-4 days after surgery.  Return to the clinic 10-14 days postop for suture removal Maintain arm in immobilizer, NWB. Aspirin daily for 2 weeks for DVT prevention

## 2018-08-03 NOTE — Anesthesia Preprocedure Evaluation (Signed)
Anesthesia Evaluation  Patient identified by MRN, date of birth, ID band Patient awake    Reviewed: Allergy & Precautions, H&P , NPO status , Patient's Chart, lab work & pertinent test results, reviewed documented beta blocker date and time   History of Anesthesia Complications Negative for: history of anesthetic complications  Airway Mallampati: II  TM Distance: >3 FB Neck ROM: full    Dental  (+) Dental Advidsory Given, Teeth Intact   Pulmonary neg shortness of breath, neg sleep apnea, neg COPD, neg recent URI, Current Smoker,           Cardiovascular Exercise Tolerance: Good negative cardio ROS       Neuro/Psych PSYCHIATRIC DISORDERS Anxiety negative neurological ROS     GI/Hepatic negative GI ROS, Neg liver ROS,   Endo/Other  negative endocrine ROS  Renal/GU negative Renal ROS  negative genitourinary   Musculoskeletal   Abdominal   Peds  Hematology negative hematology ROS (+)   Anesthesia Other Findings Past Medical History: No date: Anxiety   Reproductive/Obstetrics negative OB ROS                             Anesthesia Physical Anesthesia Plan  ASA: I  Anesthesia Plan: General   Post-op Pain Management:  Regional for Post-op pain   Induction: Intravenous  PONV Risk Score and Plan: 2 and Ondansetron, Dexamethasone, Midazolam and Promethazine  Airway Management Planned: Oral ETT  Additional Equipment:   Intra-op Plan:   Post-operative Plan: Extubation in OR  Informed Consent: I have reviewed the patients History and Physical, chart, labs and discussed the procedure including the risks, benefits and alternatives for the proposed anesthesia with the patient or authorized representative who has indicated his/her understanding and acceptance.   Dental Advisory Given  Plan Discussed with: Anesthesiologist, CRNA and Surgeon  Anesthesia Plan Comments:          Anesthesia Quick Evaluation

## 2018-08-03 NOTE — Transfer of Care (Signed)
Immediate Anesthesia Transfer of Care Note  Patient: Wendy HackerCheyenne M Luttrull  Procedure(s) Performed: SHOULDER ARTHROSCOPY WITH LABRAL REPAIR AND CAPSULAR SHIFT, ARTHROSCOPIC DISTAL CLAVICLE EXCISION SUBPECTORAL BICEPS TENODESIS (Right )  Patient Location: PACU  Anesthesia Type:General  Level of Consciousness: drowsy and patient cooperative  Airway & Oxygen Therapy: Patient Spontanous Breathing and Patient connected to nasal cannula oxygen  Post-op Assessment: Report given to RN and Post -op Vital signs reviewed and stable  Post vital signs: Reviewed and stable  Last Vitals:  Vitals Value Taken Time  BP 95/64 08/03/2018  7:17 PM  Temp    Pulse 103 08/03/2018  7:19 PM  Resp 20 08/03/2018  7:19 PM  SpO2 100 % 08/03/2018  7:19 PM  Vitals shown include unvalidated device data.  Last Pain:  Vitals:   08/03/18 1111  TempSrc: Oral  PainSc: 8          Complications: No apparent anesthesia complications

## 2018-08-05 ENCOUNTER — Encounter: Payer: Self-pay | Admitting: Orthopedic Surgery

## 2018-08-06 NOTE — Addendum Note (Signed)
Addendum  created 08/06/18 1332 by Stormy Fabianurtis, Favour Aleshire, CRNA   Charge Capture section accepted

## 2018-08-25 IMAGING — CR DG WRIST COMPLETE 3+V*R*
1 series · 4 of 4 positions shown · non-contrast
Comparison: None.

CLINICAL DATA: Right wrist pain for the past week.  No injury.

EXAM:
RIGHT WRIST - COMPLETE 3+ VIEW

[Series 1: dg wrist complete right · 0.14mm/px · 4 of 4 slices shown]
[im 1/4]
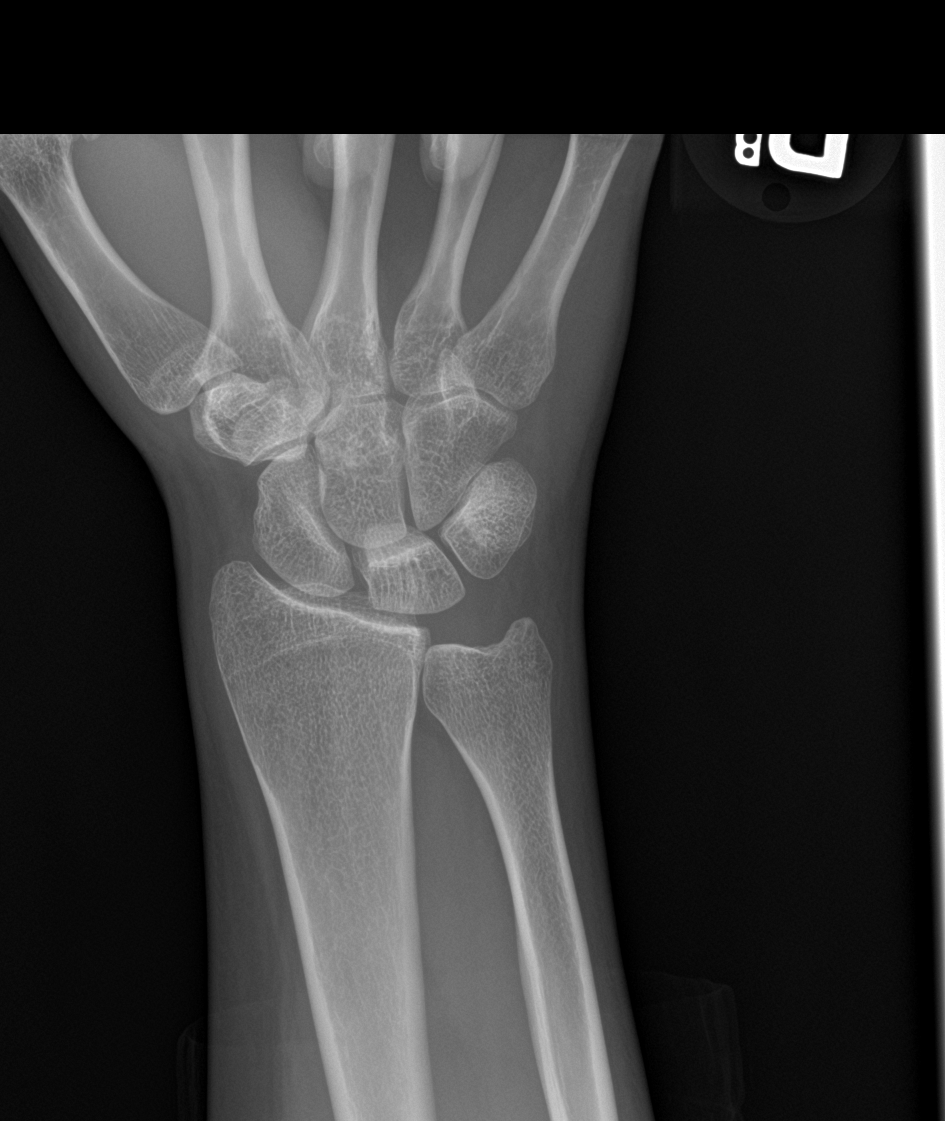
[im 2/4]
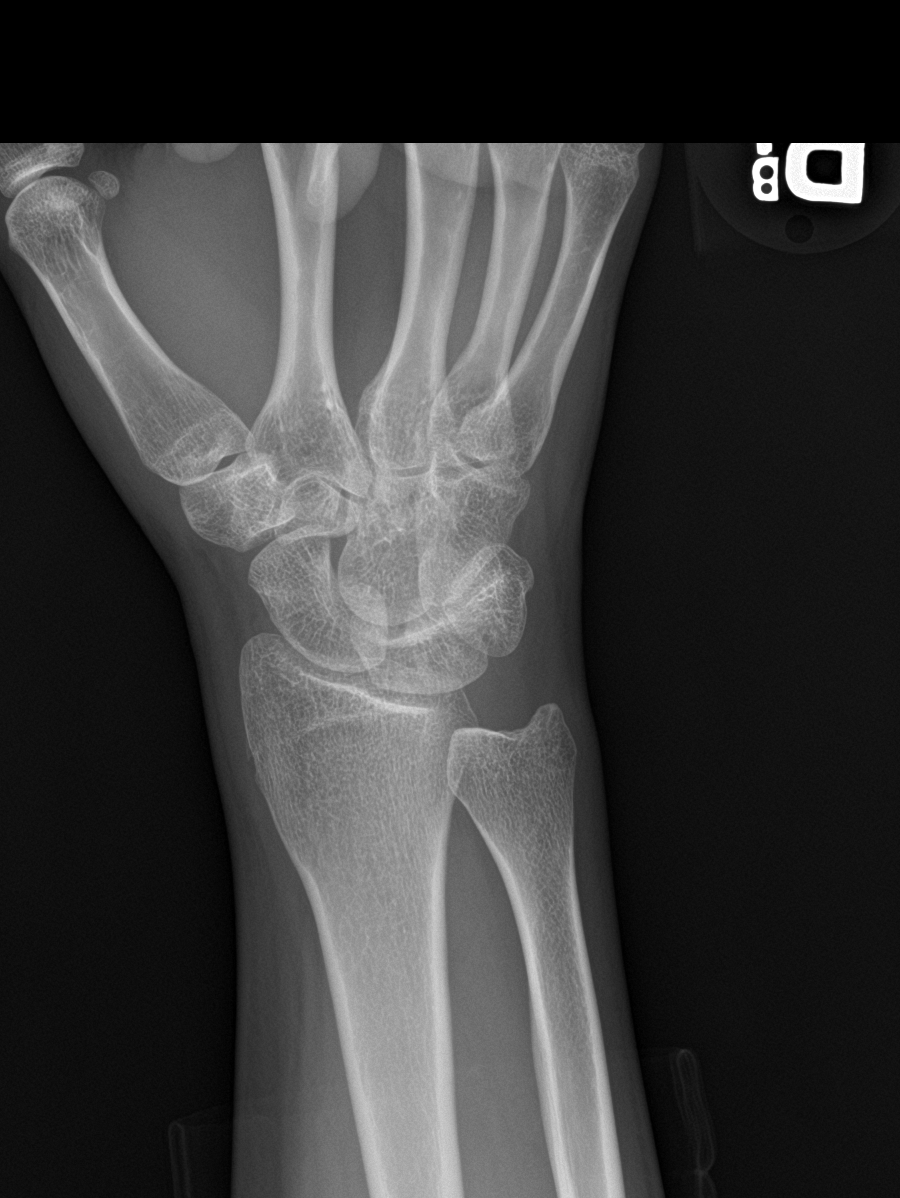
[im 3/4]
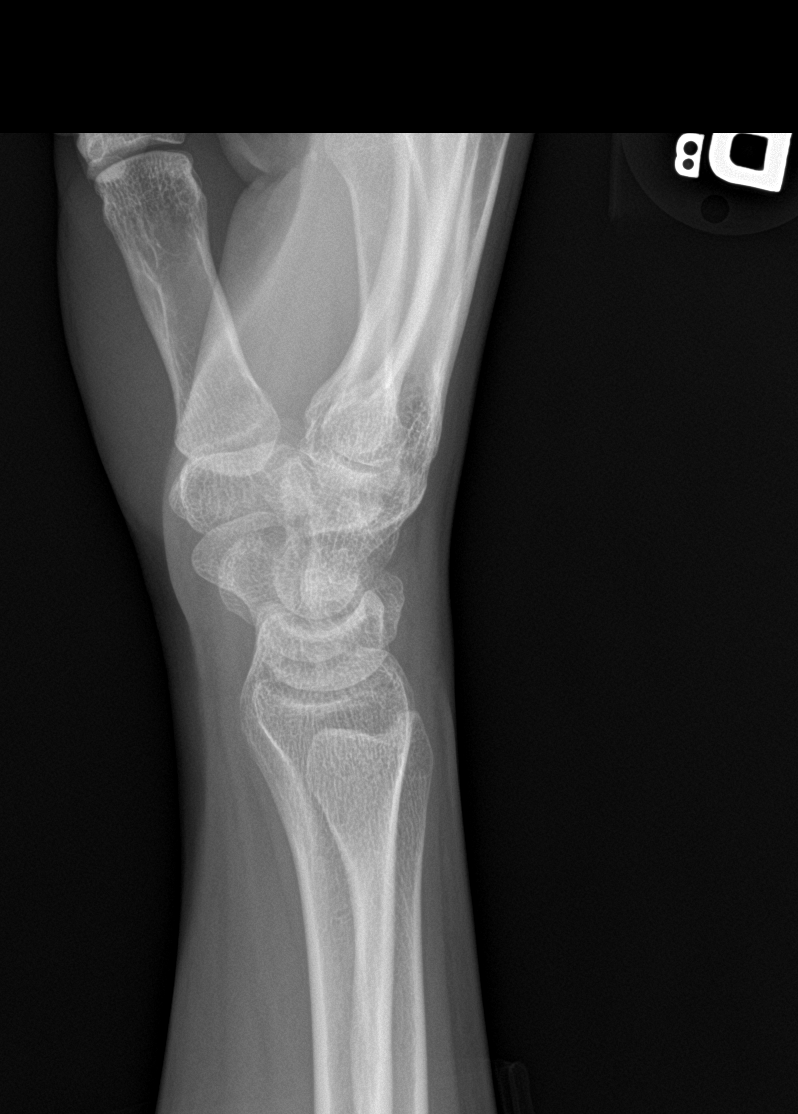
[im 4/4]
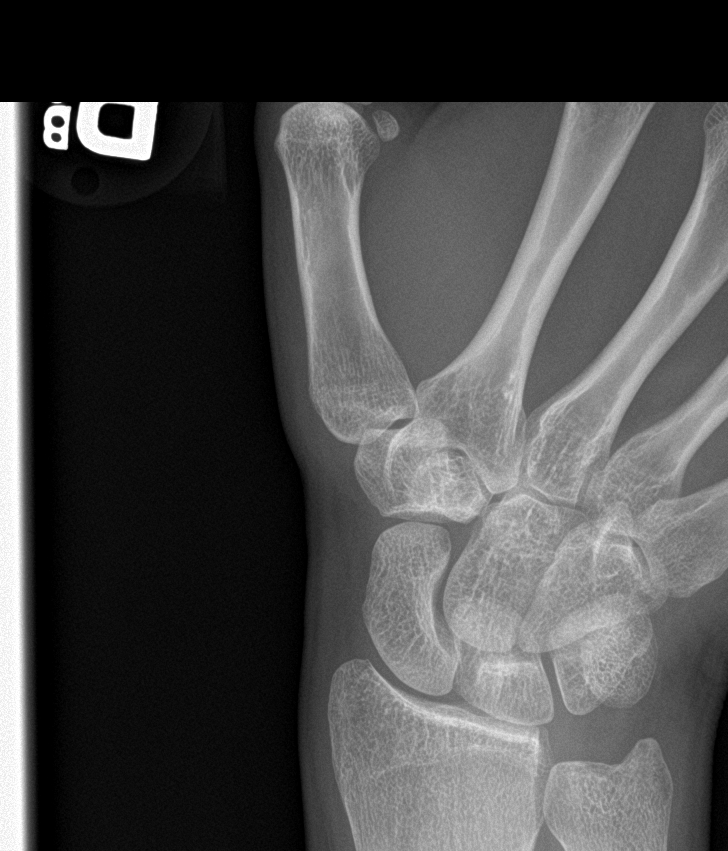

[4 of 4 positions shown; findings below may reference images not displayed]

FINDINGS: There is no evidence of fracture or dislocation. There is no
evidence of arthropathy or other focal bone abnormality. Soft
tissues are unremarkable.
IMPRESSION: Negative.

## 2020-01-10 ENCOUNTER — Other Ambulatory Visit: Payer: Self-pay | Admitting: Gastroenterology

## 2020-01-10 DIAGNOSIS — R1084 Generalized abdominal pain: Secondary | ICD-10-CM

## 2020-01-10 DIAGNOSIS — R110 Nausea: Secondary | ICD-10-CM

## 2020-01-10 DIAGNOSIS — R634 Abnormal weight loss: Secondary | ICD-10-CM

## 2020-01-10 DIAGNOSIS — R197 Diarrhea, unspecified: Secondary | ICD-10-CM
